# Patient Record
Sex: Female | Born: 1970 | Race: Black or African American | Hispanic: No | Smoking: Never smoker
Health system: Southern US, Community
[De-identification: ages and names within clinical notes are randomized; demographics above are authoritative.]

## PROBLEM LIST (undated history)

## (undated) DIAGNOSIS — G40909 Epilepsy, unspecified, not intractable, without status epilepticus: Secondary | ICD-10-CM

## (undated) HISTORY — PX: BREAST SURGERY: SHX581

## (undated) HISTORY — PX: STOMACH SURGERY: SHX791

## (undated) HISTORY — PX: TONSILLECTOMY: SUR1361

---

## 2019-07-22 ENCOUNTER — Other Ambulatory Visit: Payer: Self-pay

## 2019-07-22 ENCOUNTER — Emergency Department (HOSPITAL_BASED_OUTPATIENT_CLINIC_OR_DEPARTMENT_OTHER): Payer: Managed Care, Other (non HMO)

## 2019-07-22 ENCOUNTER — Emergency Department (HOSPITAL_BASED_OUTPATIENT_CLINIC_OR_DEPARTMENT_OTHER)
Admission: EM | Admit: 2019-07-22 | Discharge: 2019-07-22 | Disposition: A | Discharge status: Home / Self Care | Payer: Managed Care, Other (non HMO) | Attending: Emergency Medicine | Admitting: Emergency Medicine

## 2019-07-22 ENCOUNTER — Encounter (HOSPITAL_BASED_OUTPATIENT_CLINIC_OR_DEPARTMENT_OTHER): Payer: Self-pay

## 2019-07-22 DIAGNOSIS — Y998 Other external cause status: Secondary | ICD-10-CM | POA: Diagnosis not present

## 2019-07-22 DIAGNOSIS — S39012A Strain of muscle, fascia and tendon of lower back, initial encounter: Secondary | ICD-10-CM | POA: Diagnosis not present

## 2019-07-22 DIAGNOSIS — S20211A Contusion of right front wall of thorax, initial encounter: Secondary | ICD-10-CM | POA: Diagnosis not present

## 2019-07-22 DIAGNOSIS — Z79899 Other long term (current) drug therapy: Secondary | ICD-10-CM | POA: Insufficient documentation

## 2019-07-22 DIAGNOSIS — W108XXA Fall (on) (from) other stairs and steps, initial encounter: Secondary | ICD-10-CM | POA: Diagnosis not present

## 2019-07-22 DIAGNOSIS — Y92018 Other place in single-family (private) house as the place of occurrence of the external cause: Secondary | ICD-10-CM | POA: Diagnosis not present

## 2019-07-22 DIAGNOSIS — S59911A Unspecified injury of right forearm, initial encounter: Secondary | ICD-10-CM | POA: Diagnosis present

## 2019-07-22 DIAGNOSIS — S5011XA Contusion of right forearm, initial encounter: Secondary | ICD-10-CM | POA: Diagnosis not present

## 2019-07-22 DIAGNOSIS — Y9301 Activity, walking, marching and hiking: Secondary | ICD-10-CM | POA: Diagnosis not present

## 2019-07-22 HISTORY — DX: Epilepsy, unspecified, not intractable, without status epilepticus: G40.909

## 2019-07-22 MED ORDER — ACETAMINOPHEN 500 MG PO TABS
1000.0000 mg | ORAL_TABLET | Freq: Once | ORAL | Status: AC
  Administered 2019-07-22: 1000 mg via ORAL
  Filled 2019-07-22: qty 2

## 2019-07-22 NOTE — ED Triage Notes (Signed)
Pt arrives ambulatory POV with reports of falling down stairs in her home yesterday pt did try to take ibuprofen last night but reports worsening pain today with bruising to right arm, ribs, and leg.

## 2019-07-22 NOTE — ED Provider Notes (Signed)
[facility] EMERGENCY DEPARTMENT Provider Note   CSN: [id number] Arrival date & time: 07/22/19  1452     History Chief Complaint  Patient presents with  . Fall    Sherry Hoover is a 49 y.o. female.  Patient s/p fall at home yesterday. States was on steps, slipped, fell down on back and right side. No loc. Has been ambulatory since. C/o contusion/pain to right forearm, right lower ribs, and lower back. Pain acute onset post fall, moderate, dull, non radiating, worse w palpation. No radicular pain. No numbness/weakness. No headache. No nausea/vomiting. No cp or sob. No abd pain. Skin intact. Denies other pain or injury. No faintness or dizziness prior to fall.  The history is provided by the patient.  Fall Pertinent negatives include no chest pain, no abdominal pain, no headaches and no shortness of breath.       Past Medical History:  Diagnosis Date  . Epilepsy (HCC)     There are no problems to display for this patient.   Past Surgical History:  Procedure Laterality Date  . BREAST SURGERY    . STOMACH SURGERY    . TONSILLECTOMY       OB History   No obstetric history on file.     No family history on file.  Social History   Tobacco Use  . Smoking status: Never Smoker  . Smokeless tobacco: Never Used  Substance Use Topics  . Alcohol use: Never  . Drug use: Never    Home Medications Prior to Admission medications   Medication Sig Start Date End Date Taking? Authorizing Provider  ARIPiprazole (ABILIFY) 10 MG tablet Take 10 mg by mouth daily.   Yes [provider]  lamoTRIgine (LAMICTAL) 25 MG tablet Take 25 mg by mouth daily.   Yes [provider]  levETIRAcetam (KEPPRA) 100 MG/ML solution Take by mouth 2 (two) times daily.   Yes [provider]    Allergies    Patient has no known allergies.  Review of Systems   Review of Systems  Constitutional: Negative for fever.  HENT: Negative for nosebleeds.   Eyes:  Negative for pain and visual disturbance.  Respiratory: Negative for shortness of breath.   Cardiovascular: Negative for chest pain.  Gastrointestinal: Negative for abdominal pain and vomiting.  Genitourinary: Negative for flank pain.  Musculoskeletal: Negative for neck pain.  Skin: Negative for wound.  Neurological: Negative for weakness, numbness and headaches.  Hematological: Does not bruise/bleed easily.       No anticoag use.   Psychiatric/Behavioral: Negative for confusion.    Physical Exam Updated Vital Signs BP 133/84 (BP Location: Left Arm)   Pulse 75   Temp 98.8 F (37.1 C) (Oral)   Resp 18   Ht 1.6 m (5\' 3" )   Wt 54.9 kg   LMP 06/28/2019   SpO2 100%   BMI 21.43 kg/m   Physical Exam Vitals and nursing note reviewed.  Constitutional:      Appearance: Normal appearance. She is well-developed.  HENT:     Head: Atraumatic.     Nose: Nose normal.     Mouth/Throat:     Mouth: Mucous membranes are moist.  Eyes:     General: No scleral icterus.    Conjunctiva/sclera: Conjunctivae normal.     Pupils: Pupils are equal, round, and reactive to light.  Neck:     Trachea: No tracheal deviation.  Cardiovascular:     Rate and Rhythm: Normal rate and regular rhythm.  Pulses: Normal pulses.     Heart sounds: Normal heart sounds. No murmur. No friction rub. No gallop.   Pulmonary:     Effort: Pulmonary effort is normal. No respiratory distress.     Breath sounds: Normal breath sounds.     Comments: Right lower chest/rib tenderness. No crepitus. Normal chest movement.  Chest:     Chest wall: Tenderness present.  Abdominal:     General: Bowel sounds are normal. There is no distension.     Palpations: Abdomen is soft.     Tenderness: There is no abdominal tenderness. There is no guarding.     Comments: No abd contusion, bruising, pain or tenderness.   Genitourinary:    Comments: No cva tenderness.  Musculoskeletal:        General: No swelling.     Cervical back:  Normal range of motion and neck supple. No rigidity or tenderness. No muscular tenderness.     Comments: Lower lumbar tenderness, otherwise, CTLS spine, non tender, aligned, no step off.  Contusion/bruise to mid right forearm, no significant sts noted, tenderness to mid ulna. No crepitus. Compartments of forearm soft, not tense, no significant swelling. Radial pulse 2+ bil. No other focal bony tenderness noted.   Skin:    General: Skin is warm and dry.     Findings: No rash.  Neurological:     Mental Status: She is alert.     Comments: Alert, speech normal. Motor intact bil, sens grossly intact bil. Steady gait.   Psychiatric:        Mood and Affect: Mood normal.     ED Results / Procedures / Treatments   Labs (all labs ordered are listed, but only abnormal results are displayed) Labs Reviewed - No data to display  EKG None  Radiology XR Chest w R ribs  Result Date: 07/22/2019 CLINICAL DATA:  Pain status post fall EXAM: RIGHT RIBS AND CHEST - 3+ VIEW COMPARISON:  None. FINDINGS: No fracture or other bone lesions are seen involving the ribs. There is no evidence of pneumothorax or pleural effusion. Both lungs are clear. Heart size and mediastinal contours are within normal limits. IMPRESSION: Negative. Electronically Signed   By: Katherine Mantle M.D.   On: 07/22/2019 16:12   XR LS Spine  Result Date: 07/22/2019 CLINICAL DATA:  Pain status post fall EXAM: LUMBAR SPINE - COMPLETE 4+ VIEW COMPARISON:  None. FINDINGS: There is no acute displaced fracture. No dislocation. Facet arthrosis is noted at the lower lumbar segments. The disc heights are relatively well preserved except for the L5-S1 level where there is mild disc height loss. Right-sided tubal ligation clips are noted. There are no tubal ligation clips in the patient's left hemipelvis. IMPRESSION: 1. No acute abnormality. 2. Facet arthrosis at the lower lumbar segments. 3. Right-sided tubal ligation clips are noted. There are no  tubal ligation clips in the patient's left hemipelvis. Electronically Signed   By: Katherine Mantle M.D.   On: 07/22/2019 16:14   DG Forearm Right  Result Date: 07/22/2019 CLINICAL DATA:  Pain status post fall EXAM: RIGHT FOREARM - 2 VIEW COMPARISON:  None. FINDINGS: There is no evidence of fracture or other focal bone lesions. Soft tissues are unremarkable. IMPRESSION: Negative. Electronically Signed   By: Katherine Mantle M.D.   On: 07/22/2019 16:14    Procedures Procedures (including critical care time)  Medications Ordered in ED Medications  acetaminophen (TYLENOL) tablet 1,000 mg (has no administration in time range)    ED  Course  I have reviewed the triage vital signs and the nursing notes.  Pertinent labs & imaging results that were available during my care of the patient were reviewed by me and considered in my medical decision making (see chart for details).    MDM Rules/Calculators/A&P                      Imaging studies ordered.   Took ibuprofen at home today, no other meds for pain. Acetaminophen po. Po fluids.  Reviewed nursing notes and prior charts for additional history.   Xrays reviewed/interpreted by me - no fracture. Discussed w pt.   Patient currently appears stable for d/c.   Return precautions provided.      Final Clinical Impression(s) / ED Diagnoses Final diagnoses:  None    Rx / DC Orders ED Discharge Orders    None       Lajean Saver, MD 07/22/19 [id number]

## 2019-07-22 NOTE — Discharge Instructions (Addendum)
It was our pleasure to provide your ER care today - we hope that you feel better.  Take acetaminophen or ibuprofen as need for pain.  Follow up with primary care doctor in 1-2 weeks if symptoms fail to improve/resolve.  Return to ER if worse, new symptoms, new or severe pain, numbness/weakness, weak/faint, or other concern.

## 2020-03-17 IMAGING — CR DG FOREARM 2V*R*
2 series · 2 of 2 positions shown · non-contrast
Comparison: None.

CLINICAL DATA: Pain status post fall

EXAM:
RIGHT FOREARM - 2 VIEW

[x forearm ap right]
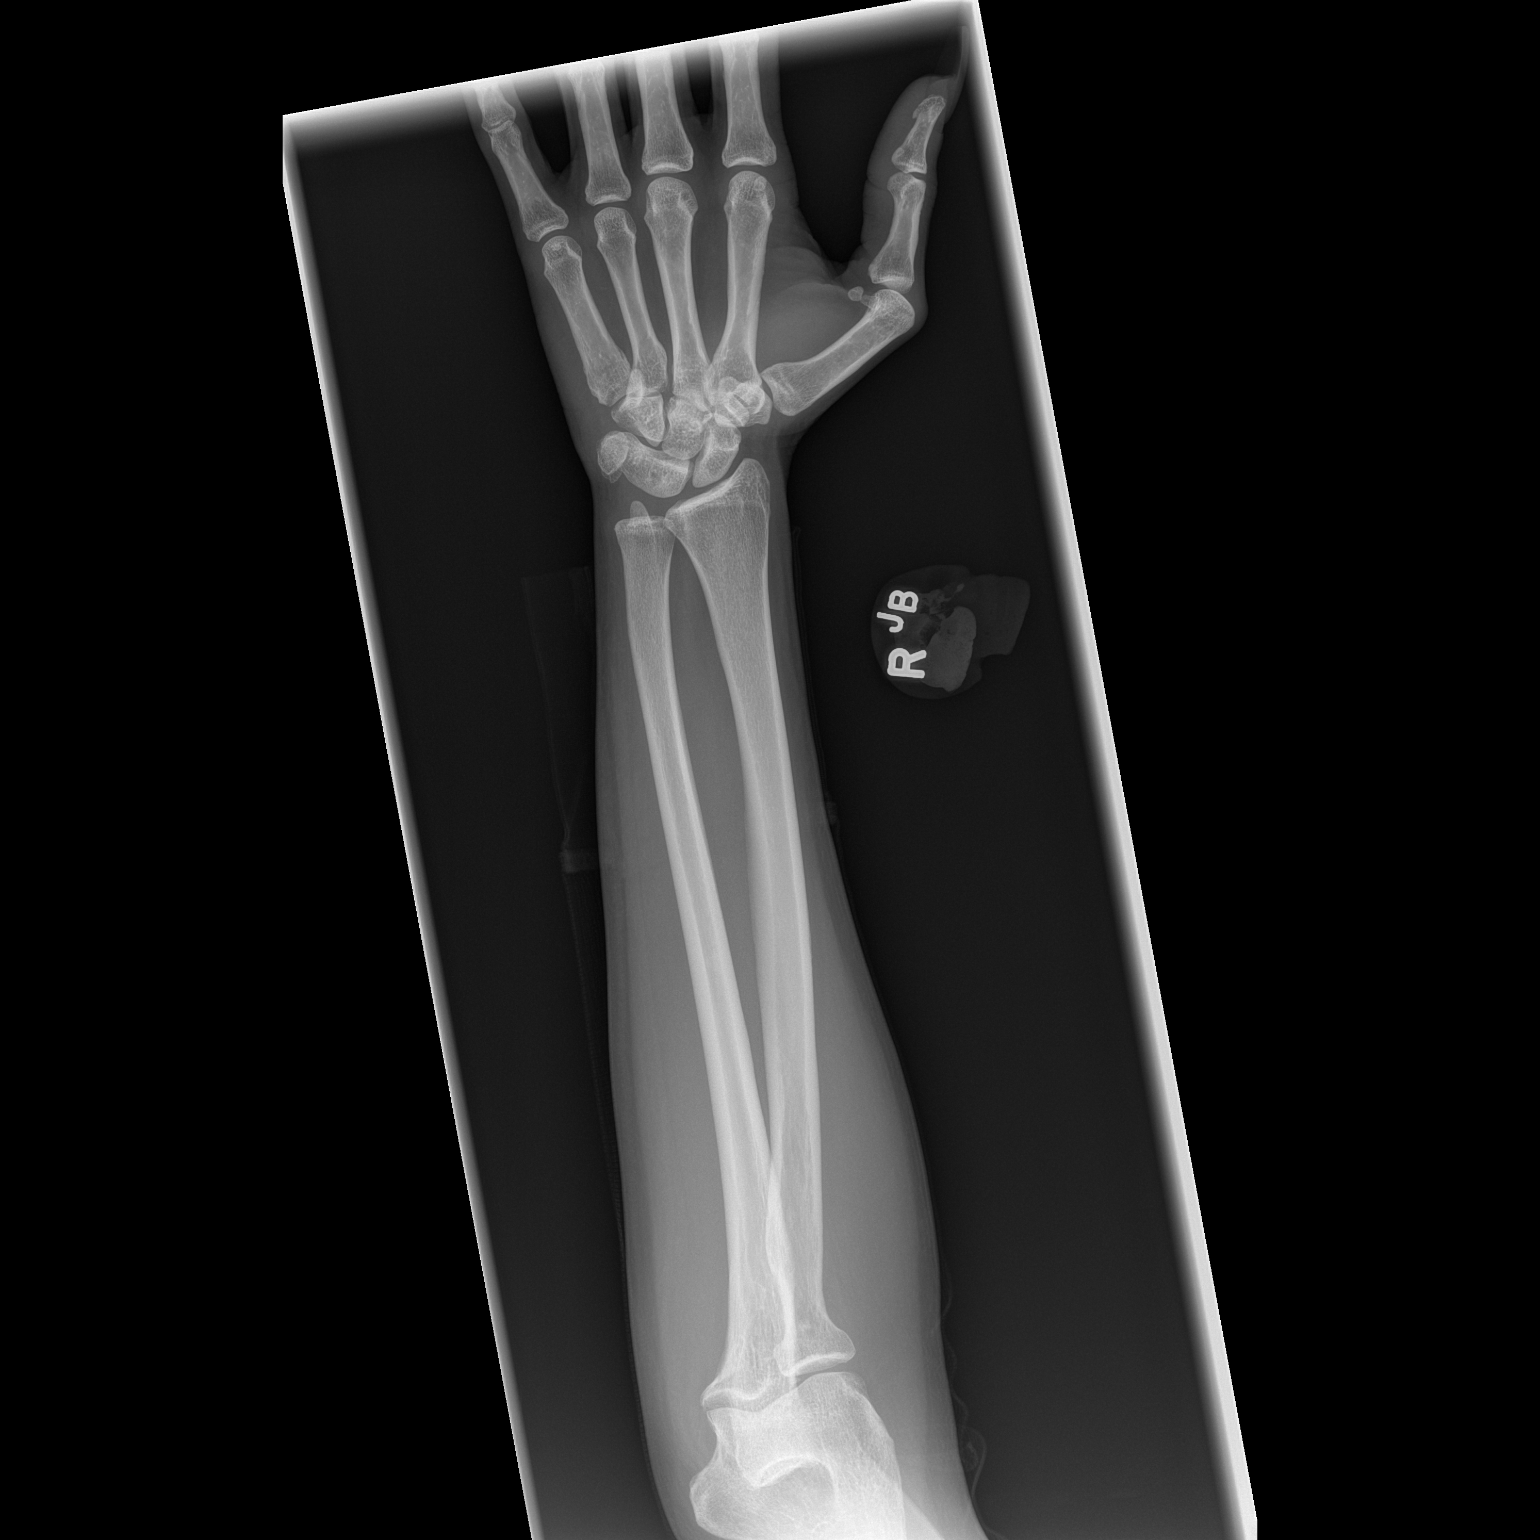

[x forearm lat right]
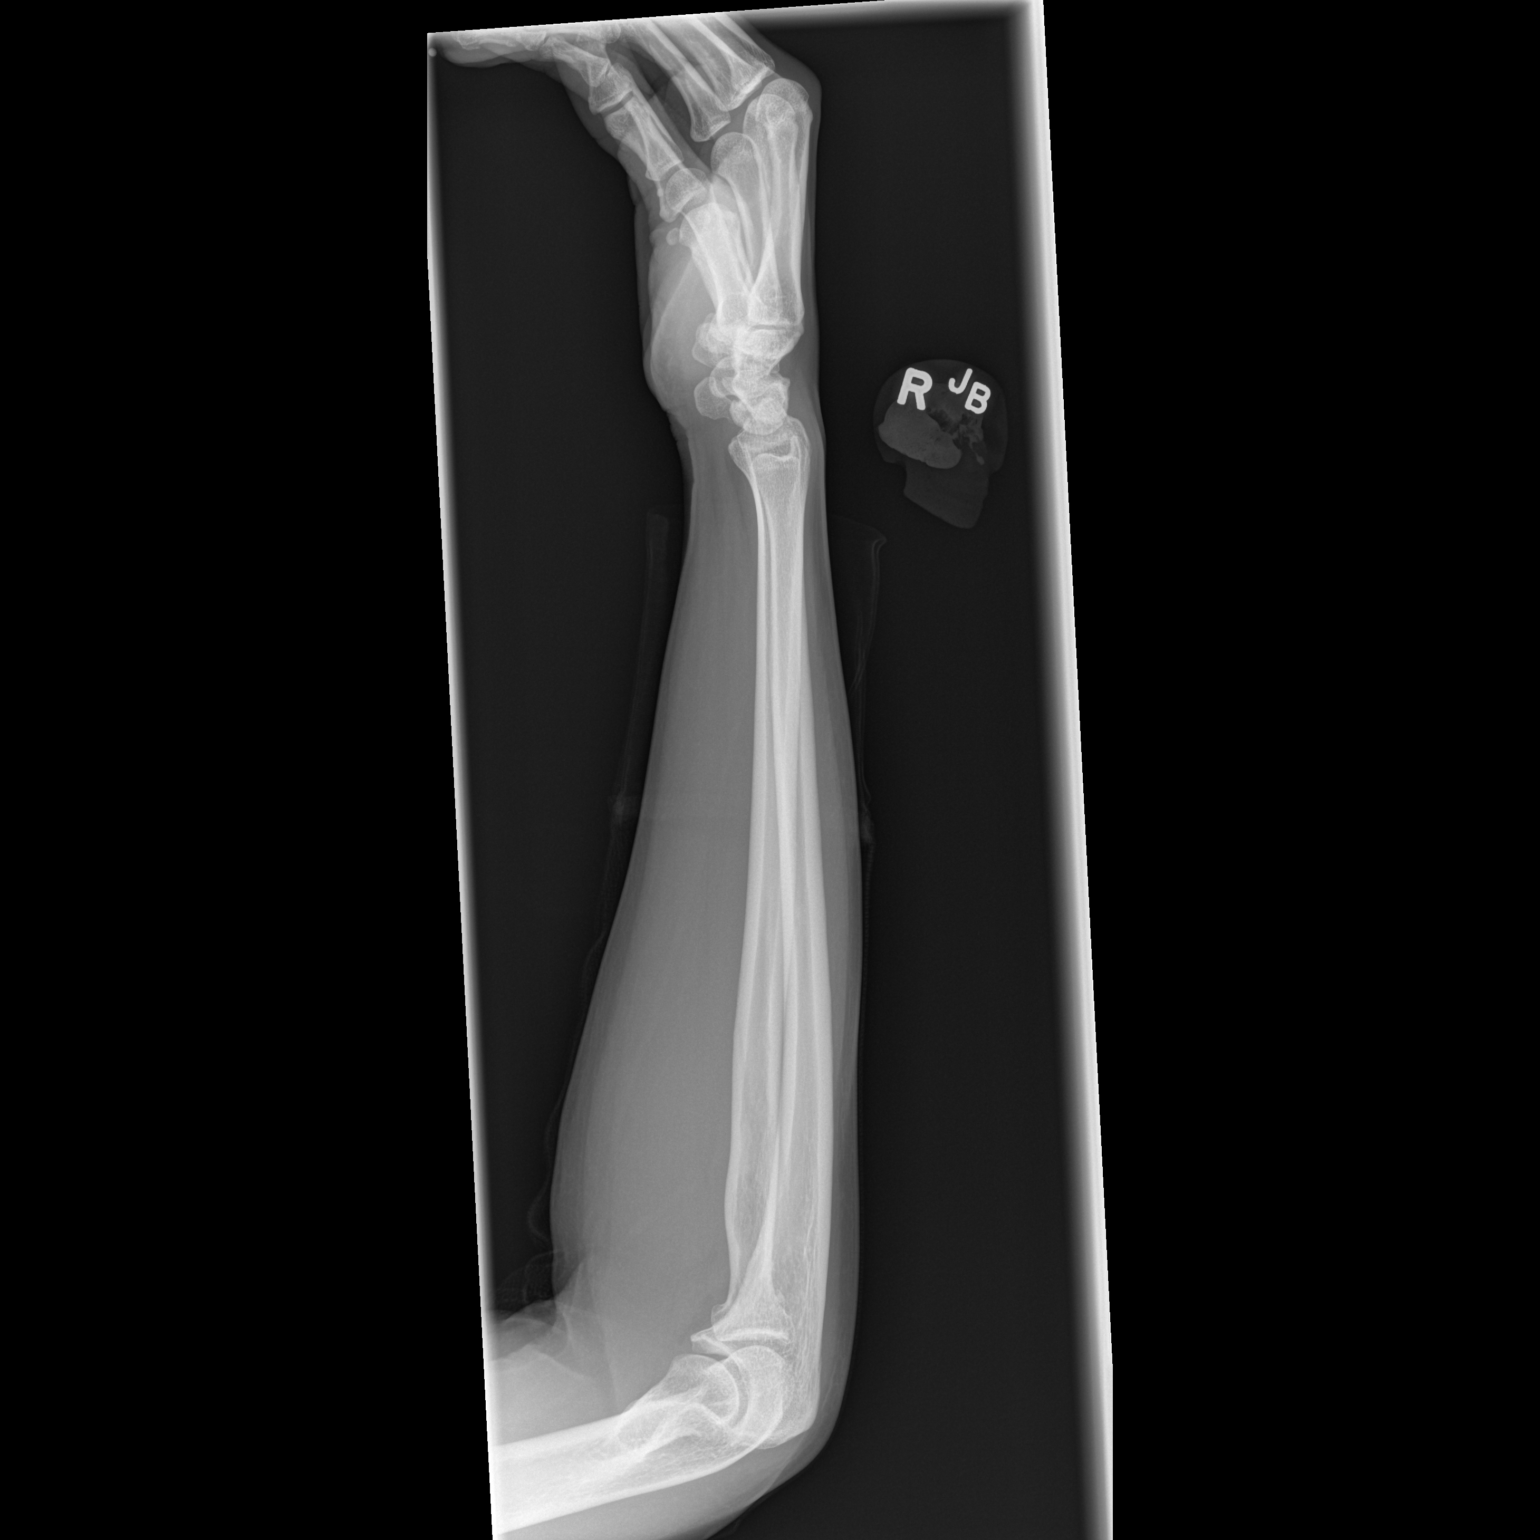

[2 of 2 positions shown; findings below may reference images not displayed]

FINDINGS: There is no evidence of fracture or other focal bone lesions. Soft
tissues are unremarkable.
IMPRESSION: Negative.

## 2023-08-10 ENCOUNTER — Other Ambulatory Visit (HOSPITAL_COMMUNITY): Payer: No Typology Code available for payment source | Attending: Psychiatry | Admitting: Psychiatry

## 2023-08-10 ENCOUNTER — Telehealth (HOSPITAL_COMMUNITY): Payer: Self-pay | Admitting: Psychiatry

## 2023-08-10 DIAGNOSIS — Z1330 Encounter for screening examination for mental health and behavioral disorders, unspecified: Secondary | ICD-10-CM

## 2023-08-10 NOTE — Telephone Encounter (Signed)
D:  Sardie Izzard, LCSW from [facility] referred pt to virtual MH-IOP.  A:  Placed call and oriented pt.  CCA scheduled today at 2:30 pm; start MH-IOP tomorrow.  Inform Sardie.  R:  Pt receptive.

## 2023-08-10 NOTE — Progress Notes (Addendum)
Virtual Visit via Video Note  I connected with Sherry Hoover on @TODAY @ at  2:30 PM EST by a video enabled telemedicine application and verified that I am speaking with the correct person using two identifiers.  Location: Patient: at home Provider: at office   I discussed the limitations of evaluation and management by telemedicine and the availability of in person appointments. The patient expressed understanding and agreed to proceed.  I discussed the assessment and treatment plan with the patient. The patient was provided an opportunity to ask questions and all were answered. The patient agreed with the plan and demonstrated an understanding of the instructions.   The patient was advised to call back or seek an in-person evaluation if the symptoms worsen or if the condition fails to improve as anticipated.  I provided 80 minutes of non-face-to-face time during this encounter.   Sherry Hoover, M.Ed,CNA   Comprehensive Clinical Assessment (CCA) Note  08/10/2023 Sherry Hoover [id number]  Chief Complaint:  Chief Complaint  Patient presents with   Depression   Visit Diagnosis: F31.32     CCA Screening, Triage and Referral (STR)  Patient Reported Information How did you hear about [place]? Other (Comment)  Referral name: [facility]  Referral phone number: No data recorded  Whom do you see for routine medical problems? Primary Care  Practice/Facility Name: [facility]  Practice/Facility Phone Number: No data recorded Name of Contact: No data recorded Contact Number: No data recorded Contact Fax Number: No data recorded Prescriber Name: Sherry Hoover  Prescriber Address (if known): No data recorded  What Is the Reason for Your Visit/Call Today? Worsening depressive symptoms  How Long Has This Been Causing You Problems? 1-6 months  What Do You Feel Would Help You the Most Today? Treatment for Depression or other mood problem   Have You Recently Been in Any Inpatient Treatment  (Hospital/Detox/Crisis Center/28-Day Program)? No  Name/Location of Program/Hospital:No data recorded How Long Were You There? No data recorded When Were You Discharged? No data recorded  Have You Ever Received Services From [facility] Before? No  Who Do You See at [facility]? No data recorded  Have You Recently Had Any Thoughts About Hurting Yourself? Yes (passively; no plan or intent)  Are You Planning to Commit Suicide/Harm Yourself At This time? No   Have you Recently Had Thoughts About Hurting Someone Sherry Hoover? No  Explanation: No data recorded  Have You Used Any Alcohol or Drugs in the Past 24 Hours? No  How Long Ago Did You Use Drugs or Alcohol? No data recorded What Did You Use and How Much? No data recorded  Do You Currently Have a Therapist/Psychiatrist? Yes  Name of Therapist/Psychiatrist: Dr. Dara Hoover at [facility]   Have You Been Recently Discharged From Any Office Practice or Programs? No  Explanation of Discharge From Practice/Program: No data recorded    CCA Screening Triage Referral Assessment Type of Contact: No data recorded Is this Initial or Reassessment? No data recorded Date Telepsych consult ordered in [facility]:  No data recorded Time Telepsych consult ordered in [facility]:  No data recorded  Patient Reported Information Reviewed? No data recorded Patient Left Without Being Seen? No data recorded Reason for Not Completing Assessment: No data recorded  Collateral Involvement: No data recorded  Does Patient Have a Court Appointed Legal Guardian? No data recorded Name and Contact of Legal Guardian: No data recorded If Minor and Not Living with Parent(s), Who has Custody? No data recorded Is CPS involved or ever been involved?  Never  Is APS involved or ever been involved? Never   Patient Determined To Be At Risk for Harm To Self or Others Based on Review of Patient Reported Information or Presenting Complaint? No  Method: No Plan  Availability of Means:  No access or NA  Intent: Vague intent or NA  Notification Required: No need or identified person  Additional Information for Danger to Others Potential: No data recorded Additional Comments for Danger to Others Potential: No data recorded Are There Guns or Other Weapons in Your Home? No  Types of Guns/Weapons: No data recorded Are These Weapons Safely Secured?                            No data recorded Who Could Verify You Are Able To Have These Secured: No data recorded Do You Have any Outstanding Charges, Pending Court Dates, Parole/Probation? denies  Contacted To Inform of Risk of Harm To Self or Others: No data recorded  Location of Assessment: Other (comment)   Does Patient Present under Involuntary Commitment? No  IVC Papers Initial File Date: No data recorded  [place] of Residence: [place]   Patient Currently Receiving the Following Services: Medication Management   Determination of Need: Routine (7 days)   Options For Referral: Intensive Outpatient Therapy     CCA Biopsychosocial Intake/Chief Complaint:  This is a 53 yr old, widowed, unemployed female, who was referred per [facility]; treatment for Bipolar 1 D/O and functional neurological d/o.  Pt reports she was dx with bipolar d/o ~ seven yrs ago.  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  Sister passed in 03-26-2023; husband died d/t heart issues in 10-14-24and shortly after pt's mother passed in [place].  Pt moved her father down here with her.  Pt is his caretaker.  Pt states she thinks her father has some mental health issues.  2) Medical Issues:  Epilepsy and Functional Neurological Disorder (FND).  Pt denied any prior psychiatric hospitalizations; except for Epilepsy (Nov. 2024).  Has been seeing Sherry Hoover @ [facility].  Admits to a suicide attempt (OD) in [employer].  Current Symptoms/Problems: Sadness, tearfulness, anhedonia, flucuating sleep, low  energy, decreased appetite, low self-esteem, poor concentration, isolative, irritable, restless,   Patient Reported Schizophrenia/Schizoaffective Diagnosis in Past: No   Strengths: "I am an advocate for others."  Preferences: "I want to get involved with people again."  Abilities: No data recorded  Type of Services Patient Feels are Needed: MH-IOP   Initial Clinical Notes/Concerns: PHQ-9=27   Mental Health Symptoms Depression:  Change in energy/activity; Difficulty Concentrating; Increase/decrease in appetite; Sleep (too much or little); Tearfulness; Hopelessness; Worthlessness; Irritability   Duration of Depressive symptoms: Greater than two weeks   Mania:  Change in energy/activity; Racing thoughts; Increased Energy (states she has "burst of energy at times"  Most recent this morning (ie. rearraging and cleaning things in her room))   Anxiety:   Difficulty concentrating; Restlessness   Psychosis:  None   Duration of Psychotic symptoms: No data recorded  Trauma:  N/A   Obsessions:  N/A; Disrupts routine/functioning; Recurrent & persistent thoughts/impulses/images   Compulsions:  N/A; Disrupts with routine/functioning; Intrusive/time consuming (checking and counting)   Inattention:  N/A   Hyperactivity/Impulsivity:  N/A   Oppositional/Defiant Behaviors:  N/A   Emotional Irregularity:  N/A   Other Mood/Personality Symptoms:  No data recorded   Mental Status  Exam Appearance and self-care  Stature:  Average   Weight:  Average weight   Clothing:  Casual   Grooming:  Normal   Cosmetic use:  Age appropriate   Posture/gait:  Normal   Motor activity:  Restless   Sensorium  Attention:  Distractible   Concentration:  Scattered; Preoccupied   Orientation:  Time; Situation; Place; Person; Object; X5   Recall/memory:  Defective in Remote   Affect and Mood  Affect:  Anxious   Mood:  Depressed   Relating  Eye contact:  Normal   Facial expression:   Responsive   Attitude toward examiner:  Cooperative   Thought and Language  Speech flow: Normal   Thought content:  Appropriate to Mood and Circumstances   Preoccupation:  None   Hallucinations:  None   Organization:  No data recorded  [employer] of Knowledge:  Fair   Intelligence:  Average   Abstraction:  Concrete   Judgement:  Normal   Reality Testing:  Variable   Insight:  Flashes of insight   Decision Making:  Paralyzed   Social Functioning  Social Maturity:  Isolates   Social Judgement:  Normal   Stress  Stressors:  Grief/losses; Illness; Transitions   Coping Ability:  Overwhelmed   Skill Deficits:  Decision making; Activities of daily living; Self-care   Supports:  Family     Religion: Religion/Spirituality Are You A Religious Person?: Yes What is Your Religious Affiliation?: Methodist  Leisure/Recreation: Leisure / Recreation Do You Have Hobbies?: Yes Leisure and Hobbies: cooking, cleaning and organizing  Exercise/Diet: Exercise/Diet Do You Exercise?: Yes What Type of Exercise Do You Do?: Weight Training How Many Times a Week Do You Exercise?: 4-5 times a week Have You Gained or Lost A Significant Amount of Weight in the Past Six Months?: No Do You Follow a Special Diet?: Yes Type of Diet: increased protein, fruits, vegetables Do You Have Any Trouble Sleeping?: Yes Explanation of Sleeping Difficulties: sleeps flucuates   CCA Employment/Education Employment/Work Situation: Employment / Work Situation Employment Situation: Unemployed What is the Longest Time Patient has Held a Job?: 1 yr Where was the Patient Employed at that Time?: [employer]; Runner, broadcasting/film/video (K-5) Has Patient ever Been in the [employer]?: No  Education: Education Is Patient Currently Attending School?: No Did Garment/textile technologist From [employer]?: Yes Did Theme park manager?: Yes What Type of College Degree Do you Have?: BA Did You Attend Graduate School?: No  (Some classes) What Was Your Major?: Education Did You Have An Individualized Education Program (IIEP): No Did You Have Any Difficulty At School?: No   CCA Family/Childhood History Family and Relationship History: Family history Marital status: Widowed Widowed, when?: Oct. 2024 What is your sexual orientation?: straight Does patient have children?: Yes How many children?: 4 How is patient's relationship with their children?: 3 sons and 1 daughter  Childhood History:  Childhood History By whom was/is the patient raised?: Both parents Additional childhood history information: Born in [place]. Mom was homemaker.  Father has his masters.  She believes he has mental issues.  Mother took care of everything.  States she can't really remember her childhood. Does patient have siblings?: Yes Number of Siblings: 1 Description of patient's current relationship with siblings: Four total; two passed. Did patient suffer any verbal/emotional/physical/sexual abuse as a child?: No Did patient suffer from severe childhood neglect?: No Has patient ever been sexually abused/assaulted/raped as an adolescent or adult?: No Was the patient ever a victim of a crime  or a disaster?: No Witnessed domestic violence?: No Has patient been affected by domestic violence as an adult?: No  Child/Adolescent Assessment:     CCA Substance Use Alcohol/Drug Use: Alcohol / Drug Use Pain Medications: cc: MAR Prescriptions: cc: MAR Over the Counter: cc: MAR History of alcohol / drug use?: No history of alcohol / drug abuse                         ASAM's:  Six Dimensions of Multidimensional Assessment  Dimension 1:  Acute Intoxication and/or Withdrawal Potential:      Dimension 2:  Biomedical Conditions and Complications:      Dimension 3:  Emotional, Behavioral, or Cognitive Conditions and Complications:     Dimension 4:  Readiness to Change:     Dimension 5:  Relapse, Continued use, or Continued  Problem Potential:     Dimension 6:  Recovery/Living Environment:     ASAM Severity Score:    ASAM Recommended Level of Treatment:     Substance use Disorder (SUD)    Recommendations for Services/Supports/Treatments: Recommendations for Services/Supports/Treatments Recommendations For Services/Supports/Treatments: IOP (Intensive Outpatient Program)  DSM5 Diagnoses: There are no active problems to display for this patient.   Patient Centered Plan: Patient is on the following Treatment Plan(s):  Depression Oriented pt.  Pt was advised of ROI must be obtained prior to any records release in order to collaborate her care with an outside provider.  Pt was advised if she has not already done so to contact the front desk to sign all necessary forms in order for MH-IOP to release info re: her care.  Consent:  Pt gives verbal consent for tx and assignment of benefits for services provided during this telehealth group process.  Pt expressed understanding and agreed to proceed. Collaboration of care:  Collaborate with Hillery Jacks, NP AEB; Dr. Park Pope AEB; Sherry Hoover, AEB; Noralee Stain, LCSW AEB.   Encouraged support groups through [facility].    Pt will improve her mood as evidenced by being happy again, managing her mood and coping with daily stressors for 5 out of 7 days for 60 days.  R:  Pt receptive.      Referrals to Alternative Service(s): Referred to Alternative Service(s):   Place:   Date:   Time:    Referred to Alternative Service(s):   Place:   Date:   Time:    Referred to Alternative Service(s):   Place:   Date:   Time:    Referred to Alternative Service(s):   Place:   Date:   Time:      @BHCOLLABOFCARE @  [place], RITA, M.Ed, CNA

## 2023-08-11 ENCOUNTER — Other Ambulatory Visit (HOSPITAL_COMMUNITY): Payer: No Typology Code available for payment source

## 2023-08-12 ENCOUNTER — Other Ambulatory Visit (HOSPITAL_COMMUNITY): Payer: No Typology Code available for payment source
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
## 2023-08-15 ENCOUNTER — Other Ambulatory Visit (HOSPITAL_COMMUNITY): Payer: No Typology Code available for payment source | Attending: Psychiatry | Admitting: Psychiatry

## 2023-08-15 ENCOUNTER — Encounter (HOSPITAL_COMMUNITY): Payer: Self-pay | Admitting: Psychiatry

## 2023-08-15 DIAGNOSIS — Z79899 Other long term (current) drug therapy: Secondary | ICD-10-CM | POA: Insufficient documentation

## 2023-08-15 DIAGNOSIS — R413 Other amnesia: Secondary | ICD-10-CM | POA: Insufficient documentation

## 2023-08-15 DIAGNOSIS — G4733 Obstructive sleep apnea (adult) (pediatric): Secondary | ICD-10-CM | POA: Insufficient documentation

## 2023-08-15 DIAGNOSIS — F319 Bipolar disorder, unspecified: Secondary | ICD-10-CM | POA: Diagnosis present

## 2023-08-15 NOTE — Progress Notes (Signed)
Virtual Visit via Video Note   I connected with Terina Lejeune on 08/15/23 at  9:00 AM EDT by a video enabled telemedicine application and verified that I am speaking with the correct person using two identifiers.   At orientation to the IOP program, Case Manager discussed the limitations of evaluation and management by telemedicine and the availability of in person appointments. The patient expressed understanding and agreed to proceed with virtual visits throughout the duration of the program.   Location:  Patient: Patient Home Provider: [facility]   History of Present Illness: Bipolar I Disorder    Observations/Objective: Check In: Case Manager checked in with all participants to review discharge dates, insurance authorizations, work-related documents and needs from the treatment team regarding medications. Tykiera stated needs and engaged in discussion.    Initial Therapeutic Activity: Counselor facilitated a check-in with Nadalie to assess for safety, sobriety and medication compliance.  Counselor also inquired about [employer]'s current emotional ratings, as well as any significant changes in thoughts, feelings or behavior since previous check in.  Treasure presented for session on time and was alert, oriented x5, with no evidence or self-report of active SI/HI or A/V H.  Kaydynce reported compliance with medication and denied use of alcohol or illicit substances.  Tifani reported scores of 7/10 for depression, 8/10 for anxiety, and 8/10 for irritability.  Ashayla denied any recent panic attacks.  Sun reported that a recent success has been trying to get approved for disability.  Rayya reported that a struggle has been dealing with outbursts, as she is grieving several losses, including her mother in December.  Josslyn reported that her goal today is to help her father with some tasks around the home.        Second Therapeutic Activity: Counselor invited members to participate in peaceful  place guided imagery activity today.  Counselor explained how this is a powerful visualization tool which can aid in reducing stress while increasing sense of calm, control, and awareness if practiced regularly.  Counselor informed members beforehand that if they became uncomfortable at any point during activity, they could stop and open their eyes.  Counselor invited members to get comfortable, achieve a relaxing breathing rhythm, close their eyes, and then guided them through process of creating a 'peaceful place' which filled them with safety and calm.  Counselor encouraged members to include sensory details involving vision, sound, touch, smell, and taste which they considered pleasant to enhance experience.  After practicing in session, counselor invited members to share their opinion on the activity, including whether they were able to imagine a specific place, what details stood out to them, and how this made them feel during and after.  Intervention effectiveness could not be measured, as client participated in exercise, but did not report about their overall experience or whether they would try this again on their own.    Assessment and Plan: Counselor recommends that Emmaleigh remain in IOP treatment to better manage mental health symptoms, ensure stability and pursue completion of treatment plan goals. Counselor recommends adherence to crisis/safety plan, taking medications as prescribed, and following up with medical professionals if any issues arise.    Follow Up Instructions: Counselor will send Webex link for session tomorrow.  Perpetua was advised to call back or seek an in-person evaluation if the symptoms worsen or if the condition fails to improve as anticipated.   Collaboration of Care:   Medication Management AEB Hillery Jacks, NP or Dr. Park Pope  Case Manager AEB [name], CNA    Patient/Guardian was advised Release of Information must be  obtained prior to any record release in order to collaborate their care with an outside provider. Patient/Guardian was advised if they have not already done so to contact the registration department to sign all necessary forms in order for [place] to release information regarding their care.    Consent: Patient/Guardian gives verbal consent for treatment and assignment of benefits for services provided during this visit. Patient/Guardian expressed understanding and agreed to proceed.   I provided 180 minutes of non-face-to-face time during this encounter.   Noralee Stain, LCSW, LCAS 08/15/23

## 2023-08-16 ENCOUNTER — Other Ambulatory Visit (HOSPITAL_COMMUNITY): Payer: No Typology Code available for payment source | Admitting: Psychiatry

## 2023-08-16 ENCOUNTER — Telehealth (HOSPITAL_COMMUNITY): Payer: Self-pay | Admitting: Psychiatry

## 2023-08-16 DIAGNOSIS — F319 Bipolar disorder, unspecified: Secondary | ICD-10-CM

## 2023-08-16 NOTE — Progress Notes (Signed)
 Virtual Visit via Video Note   I connected with Sherry Hoover on 08/16/23 at  9:00 AM EDT by a video enabled telemedicine application and verified that I am speaking with the correct person using two identifiers.   At orientation to the IOP program, Case Manager discussed the limitations of evaluation and management by telemedicine and the availability of in person appointments. The patient expressed understanding and agreed to proceed with virtual visits throughout the duration of the program.   Location:  Patient: Patient Home Provider: [facility]   History of Present Illness: Bipolar I Disorder    Observations/Objective: Check In: Case Manager checked in with all participants to review discharge dates, insurance authorizations, work-related documents and needs from the treatment team regarding medications. Sherry Hoover stated needs and engaged in discussion.    Initial Therapeutic Activity: Counselor facilitated a check-in with Sherry Hoover to assess for safety, sobriety and medication compliance.  Counselor also inquired about Sherry Hoover's current emotional ratings, as well as any significant changes in thoughts, feelings or behavior since previous check in.  Sherry Hoover presented for session on time and was alert, oriented x5, with no evidence or self-report of active SI/HI or A/V H.  Sherry Hoover reported compliance with medication and denied use of alcohol or illicit substances.  Sherry Hoover reported scores of 8/10 for depression, 7/10 for anxiety, and 7/10 for anger/irritability.  Sherry Hoover denied any recent outbursts or panic attacks.  Sherry Hoover reported that a recent struggle was isolating in her room yesterday after group, stating "I always think of some excuse not to go out".  Sherry Hoover reported that a struggle has been worrying about a flight back home this coming Saturday with her father to clear out her parents' house, which she anticipates will be stressful.  Sherry Hoover reported that her goal today is to get her  nails done for self-care.       Second Therapeutic Activity: Counselor introduced Amanda Davee Lomax, [facility] Chaplain to provide psychoeducation on topic of Grief and Loss with members today.  Sherry Hoover began discussion by checking in with the group about their baseline mood today, general thoughts on what grief means to them and how it has affected them personally in the past.  Sherry Hoover provided information on how the process of grief/loss can differ depending upon one's unique culture, and categories of loss one could experience (i.e. loss of a person, animal, relationship, job, identity, etc).  Sherry Hoover encouraged members to be mindful of how pervasive loss can be, and how to recognize signs which could indicate that this is having an impact on one's overall mental health and wellbeing.  Intervention effectiveness was mixed, as client did not participate aside from answering an icebreaker question posed at beginning by the chaplain.    Third Therapeutic Activity: Counselor provided demonstration of relaxation technique known as mindful breathing meditation to help members increase sense of calm, resiliency, and control.  Counselor guided members through process of getting comfortable, achieving a relaxed breathing rhythm, and focusing on this for several minutes, allowing troubling thoughts and feelings to come and go without rumination.  Counselor processed effectiveness of activity afterward in discussion with members, including how this impacted their mental state, whether it was difficult to stay focused, and if they plan to include it in self-care routine to improve day-to-day coping.  Intervention was effective, as evidenced by [name] participating in exercise, and reporting that this was helpful for relaxing her, although she almost fell asleep.  She reported that it could be useful at night  when she is anxious and having trouble sleeping.   Assessment and Plan: Counselor recommends that [name] remain in  IOP treatment to better manage mental health symptoms, ensure stability and pursue completion of treatment plan goals. Counselor recommends adherence to crisis/safety plan, taking medications as prescribed, and following up with medical professionals if any issues arise.    Follow Up Instructions: Counselor will send Webex link for session tomorrow.  [name] was advised to call back or seek an in-person evaluation if the symptoms worsen or if the condition fails to improve as anticipated.   Collaboration of Care:   Medication Management AEB Staci Kerns, NP or Dr. Prentice Espy                                          Case Manager AEB Ricka Gaskins, CNA    Patient/Guardian was advised Release of Information must be obtained prior to any record release in order to collaborate their care with an outside provider. Patient/Guardian was advised if they have not already done so to contact the registration department to sign all necessary forms in order for us  to release information regarding their care.    Consent: Patient/Guardian gives verbal consent for treatment and assignment of benefits for services provided during this visit. Patient/Guardian expressed understanding and agreed to proceed.   I provided 180 minutes of non-face-to-face time during this encounter.   [name], LCSW, LCAS 08/16/23

## 2023-08-17 ENCOUNTER — Telehealth (HOSPITAL_COMMUNITY): Payer: Self-pay | Admitting: Psychiatry

## 2023-08-17 ENCOUNTER — Other Ambulatory Visit (HOSPITAL_COMMUNITY): Payer: No Typology Code available for payment source

## 2023-08-17 NOTE — Telephone Encounter (Signed)
 D:  Pt had logged off group.  A:  Placed call to pt.  According to pt, her son showed up unexpectedly.  I have a family issue that I must take care of.  Asked pt if she would be able to rejoin group soon?  Pt replied that she would rejoin as soon as she could.  Inform [name], LCSW and Dr. Lynnette.

## 2023-08-17 NOTE — Telephone Encounter (Signed)
 D:  According to the group facilitator Fenton Ricker, [place]), patient never returned to virtual MH-IOP.  Spoke to [employer] (pharmacist) whose group pt was in.  According to [place], pt logged off five minutes before she ended group at 10:20 a.m..  A:  Placed call to check on patient but there was no answer.  Left vm requesting patient to call the MH-IOP case manager back.  Inform Dr. Lynnette and treatment team.

## 2023-08-17 NOTE — Telephone Encounter (Signed)
 D:  Follow up with patient since she never returned the case manager's call.  Placed call to pt.  Pt states she was laying down.  No, I didn't return to group because I was dealing with my son and my father.  I just took one of my pills and laid down.  I am in my room.  Pt denies SI/HI or A/V hallucinations.  States she is just resting.  I will be in group at 9 a.m in the morning.  A:  Provided pt with support.  Reiterated to pt, if she starts having any SI sx's to call [phone] or go to [facility] or nearest ED for an assessment.  Informed Dr. Lynnette and treatment team.  R:  Pt receptive.

## 2023-08-18 ENCOUNTER — Other Ambulatory Visit (HOSPITAL_COMMUNITY): Payer: No Typology Code available for payment source

## 2023-08-18 DIAGNOSIS — F319 Bipolar disorder, unspecified: Secondary | ICD-10-CM | POA: Diagnosis not present

## 2023-08-18 NOTE — Progress Notes (Signed)
 Virtual Visit via Video Note   I connected with Laverta Banh on 08/18/23 at  9:00 AM EDT by a video enabled telemedicine application and verified that I am speaking with the correct person using two identifiers.   At orientation to the IOP program, Case Manager discussed the limitations of evaluation and management by telemedicine and the availability of in person appointments. The patient expressed understanding and agreed to proceed with virtual visits throughout the duration of the program.   Location:  Patient: Patient Home Provider: Home Office   History of Present Illness: Bipolar I Disorder    Observations/Objective: Check In: Case Manager checked in with all participants to review discharge dates, insurance authorizations, work-related documents and needs from the treatment team regarding medications. Trenice stated needs and engaged in discussion.    Initial Therapeutic Activity: Counselor facilitated a check-in with Saanya to assess for safety, sobriety and medication compliance.  Counselor also inquired about Cherl's current emotional ratings, as well as any significant changes in thoughts, feelings or behavior since previous check in.  Tomia presented for session on time and was alert, oriented x5, with no evidence or self-report of active SI/HI or A/V H.  Kathleena reported compliance with medication and denied use of alcohol or illicit substances.  Jameria reported scores of 7/10 for depression, 6/10 for anxiety, and 5/10 for anger/irritability.  Mariabella denied any recent outbursts or panic attacks.  Robyn reported that a struggle was having to leave group early yesterday due to her son showing up unexpectedly and triggering a migraine.  Kelli reported that her goal today is start packing for her upcoming trip to [place]  this weekend, and take her father to the senior center to give herself some alone time.       Second Therapeutic Activity: Counselor introduced topic of  self-care today.  Counselor explained how this can be defined as the things one does to maintain good health and improve well-being.  Counselor provided members with a self-care assessment form to complete.  This handout featured various sub-categories of self-care, including physical, psychological/emotional, social, spiritual, and professional.  Members were asked to rank their engagement in the activities listed for each dimension on a scale of 1-3, with 1 indicating 'Poor', 2 indicating 'Ok', and 3 indicating 'Well'.  Counselor invited members to share results of their assessment, and inquired about which areas of self-care they are doing well in, as well as areas that require attention, and how they plan to begin addressing this during treatment.  Intervention was ineffective, as evidenced by Zoila reporting that she became overwhelmed by the first section, since she can hyperfixate on aspects of physical self-care such as diet and exercise.  Counselor suggested she focus on the second section instead, psychological/emotional self-care.  Aubrey reported that she needed to leave early before sharing results of this assessment, as someone had shown up unexpectedly at the house.  Mitzie left the session at 11:35am.  Counselor informed case manager of client's early departure from group.    Assessment and Plan: Counselor recommends that Zahniya remain in IOP treatment to better manage mental health symptoms, ensure stability and pursue completion of treatment plan goals. Counselor recommends adherence to crisis/safety plan, taking medications as prescribed, and following up with medical professionals if any issues arise.    Follow Up Instructions: Counselor will send Webex link for session tomorrow.  Adelynn was advised to call back or seek an in-person evaluation if the symptoms worsen or if the condition fails to improve  as anticipated.   Collaboration of Care:   Medication Management AEB Staci Kerns, NP or Dr. Prentice Espy                                          Case Manager AEB Ricka Gaskins, CNA    Patient/Guardian was advised Release of Information must be obtained prior to any record release in order to collaborate their care with an outside provider. Patient/Guardian was advised if they have not already done so to contact the registration department to sign all necessary forms in order for us  to release information regarding their care.    Consent: Patient/Guardian gives verbal consent for treatment and assignment of benefits for services provided during this visit. Patient/Guardian expressed understanding and agreed to proceed.   I provided 155 minutes of non-face-to-face time during this encounter.   [name], LCSW, LCAS 08/18/23

## 2023-08-18 NOTE — Patient Instructions (Signed)
 D:  Patient will complete virtual MH-IOP tomorrow (08-19-23) per her request.  A:  Follow up with Dr. Wyonia Hefty at [facility] on 09-08-23 @ 8 a.m (video).  Strongly encourage support groups through [facility] [employer] [facility] [phone].  R:  Patient receptive.

## 2023-08-19 ENCOUNTER — Other Ambulatory Visit (HOSPITAL_COMMUNITY): Payer: No Typology Code available for payment source | Admitting: Family

## 2023-08-19 DIAGNOSIS — F319 Bipolar disorder, unspecified: Secondary | ICD-10-CM

## 2023-08-19 NOTE — Progress Notes (Signed)
 Virtual Visit via Video Note  I connected with Sherry Hoover on @TODAY @ at  9:00 AM EST by a video enabled telemedicine application and verified that I am speaking with the correct person using two identifiers.  Location: Patient: at home Provider: at office   I discussed the limitations of evaluation and management by telemedicine and the availability of in person appointments. The patient expressed understanding and agreed to proceed.  I discussed the assessment and treatment plan with the patient. The patient was provided an opportunity to ask questions and all were answered. The patient agreed with the plan and demonstrated an understanding of the instructions.   The patient was advised to call back or seek an in-person evaluation if the symptoms worsen or if the condition fails to improve as anticipated.  I provided 30 minutes of non-face-to-face time during this encounter.   Sherry Hoover, M.Ed,CNA   Patient ID: Sherry Hoover, female   DOB: April 24, 1971, 53 y.o.   MRN: [id number] D:  This is a 53 yr old, widowed, unemployed female, who was referred per [facility]; treatment for Bipolar 1 D/O and functional neurological d/o.  Pt reports she was dx with bipolar d/o ~ seven yrs ago.  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  Sister passed in 04-17-23; husband died d/t heart issues in 2024-11-05and shortly after pt's mother passed in [place] .  Pt moved her father down here with her.  Pt is his caretaker.  Pt states she thinks her father has some mental health issues.  2) Medical Issues:  Epilepsy and Functional Neurological Disorder (FND).  Pt denied any prior psychiatric hospitalizations; except for Epilepsy (Nov. 2024).  Has been seeing Dr. Dorian Chandler @ [facility].  Admits to a suicide attempt (OD) in [place].   Pt only attended one week (5 days).  Requesting discharge today d/t flying out tomorrow morning to [place] .  States her brother  purchase tickets for she and her father to go to [place]  to assist with clearing out parent's home.  I thought since the group is on video that I could be anywhere.  Explained to patient that our providers aren't licensed in [place]  so she would have to be discharged.  According to pt, she would be out of state for two weeks.  Pt states she feels about the same.  Denies SI/HI or A/V hallucinations or paranoia.  On a scale of 1-10 (10 being the worst); pt rates her depression an anxiety at a 8.5. Pt inquired what will she do upon arrival from [place] .  Informed pt, she will follow up with Dr. Dorian Chandler on 09-08-23 @ 8 a.m (video).  Reiterated to pt, after today, she will be back under Dr. Anabel services.  Strongly encouraged pt to follow up with Dr. Chandler upon her return and discuss options for treatment (ie. Therapist, etc).  Also, discussed safety options at length with pt for if a crisis should arise while in [place] .  Informed pt by her disrupting her treatment and ending on her own accord, patients usually don't return back to [facility] MH-IOP until ~ three-four months later; in order to work with psychiatrist and therapist 1:1.  Informed pt that she can call the MH-IOP case manager after she returns to town if she would like to attend an IOP group and cm would give her some referral options. A:  Discharge today per patient's request.  F/U with Dr. Chandler on 09-08-23 @  8 a.m. (video) Pt was advised of ROI must be obtained prior to any records release in order to collaborate her care with an outside provider.  Pt was advised if she has not already done so to contact the front desk to sign all necessary forms in order for MH-IOP to release info re: her care. Reiterated to pt that a ROI is needed before case mgr can send Dr. Jasmine notes from her chart. Consent:  Pt gives verbal consent for tx and assignment of benefits for services provided during this telehealth group process.  Pt expressed understanding and  agreed to proceed. Collaboration of care:  Collaborate with Staci Kerns, NP AEB; Dr. Prentice Espy AEB; Dr. Dorian Jasmine, AEB; [name], LCSW AEB.   Encouraged support groups through [facility] [facility].  R:  Pt receptive.   Sherry Hoover, M.Ed,CNA

## 2023-08-19 NOTE — Progress Notes (Signed)
 Virtual Visit via Video Note   I connected with Sherry Hoover on 08/19/23 at  9:00 AM EDT by a video enabled telemedicine application and verified that I am speaking with the correct person using two identifiers.   At orientation to the IOP program, Case Manager discussed the limitations of evaluation and management by telemedicine and the availability of in person appointments. The patient expressed understanding and agreed to proceed with virtual visits throughout the duration of the program.   Location:  Patient: Patient Home Provider: [facility]   History of Present Illness: Bipolar I Disorder and GAD   Observations/Objective: Check In: Case Manager checked in with all participants to review discharge dates, insurance authorizations, work-related documents and needs from the treatment team regarding medications. Sherry Hoover stated needs and engaged in discussion.    Initial Therapeutic Activity: Counselor facilitated a check-in with Sherry Hoover to assess for safety, sobriety and medication compliance.  Counselor also inquired about Sherry Hoover's current emotional ratings, as well as any significant changes in thoughts, feelings or behavior since previous check in.  Sherry Hoover presented for session on time and was alert, oriented x5, with no evidence or self-report of active SI/HI or A/V H.  Sherry Hoover reported compliance with medication and denied use of alcohol or illicit substances.  Sherry Hoover reported scores of 8/10 for depression, 8/10 for anxiety, and 0/10 for anger/irritability.  Sherry Hoover denied any recent outbursts or panic attacks.  Sherry Hoover reported that a recent success was taking her dad to the senior center yesterday, which gave her a break from dealing with family stress.  Sherry Hoover reported that a struggle was having to leave group early yesterday when she had an Biomedical scientist show up earlier than planned to take her father to the senior center.  Sherry Hoover reported that her goal today is begin packing for  her trip to [place]  this weekend with her father.       Second Therapeutic Activity: Counselor introduced topic of building a social support network today.  Counselor explained how this can be defined as having a having a group of healthy people in one's life you can talk to, spend time with, and get help from to improve both mental and physical health.  Counselor noted that some barriers can make it difficult to connect with other people, including the presence of anxiety or depression, or moving to an unfamiliar area.  Group members were asked to assess the current state of their support network, and identify ways that this could be improved.  Tips were given on how to address previously noted barriers, such as strengthening social skills, using relaxation techniques to reduce anxiety, scheduling social time each week, and/or exploring social events nearby which could increase chances of meeting new supports.  Members were also encouraged to consider getting closer to people they already know through suggestions such as outreaching someone by text, email or phone call if they haven't spoken in awhile, doing something nice for a friend/family member unexpectedly, and/or inviting someone over for a game/movie/dinner night.  Intervention was effective, as evidenced by Sherry Hoover actively participating in discussion on the subject, and reporting that she used to have a strong support network, but over time she has ended up very isolated and alone.  Sherry Hoover reported that when her children were younger and in school, she would participate in activities where she met other parents with similar values and interests.  Sherry Hoover reported that she has also moved to several states, which made it difficult to maintain connections with  many friends.  Sherry Hoover stated "I don't even know where I would start at this point".  She reported that her goal will be to make new connections by meeting people engaged in workout classes at the  gym, attending support groups at [facility], joining a French language class, or volunteering with [employer].    Assessment and Plan: Sherry Hoover has requested to be discharged from MHIOP early due to need to assist family in another state.  Clinician recommends adherence to crisis/safety plan, taking medications as prescribed, and following up with medical professionals if any issues arise.    Follow Up Instructions: [name] was advised to call back or seek an in-person evaluation if the symptoms worsen or if the condition fails to improve as anticipated.   Collaboration of Care:   Medication Management AEB Staci Kerns, NP or Dr. Prentice Espy                                          Case Manager AEB Sherry Gaskins, CNA    Patient/Guardian was advised Release of Information must be obtained prior to any record release in order to collaborate their care with an outside provider. Patient/Guardian was advised if they have not already done so to contact the registration department to sign all necessary forms in order for us  to release information regarding their care.    Consent: Patient/Guardian gives verbal consent for treatment and assignment of benefits for services provided during this visit. Patient/Guardian expressed understanding and agreed to proceed.   I provided 180 minutes of non-face-to-face time during this encounter.   [name], LCSW, LCAS 08/19/23

## 2023-08-19 NOTE — Progress Notes (Addendum)
 Virtual Visit via Video Note  I connected with Sherry Hoover on 08/19/23 at  9:00 AM EST by a video enabled telemedicine application and verified that I am speaking with the correct person using two identifiers.  Location: Patient: Home Provider: Office   I discussed the limitations of evaluation and management by telemedicine and the availability of in person appointments. The patient expressed understanding and agreed to proceed.   I discussed the assessment and treatment plan with the patient. The patient was provided an opportunity to ask questions and all were answered. The patient agreed with the plan and demonstrated an understanding of the instructions.   The patient was advised to call back or seek an in-person evaluation if the symptoms worsen or if the condition fails to improve as anticipated.  I provided 15 minutes of non-face-to-face time during this encounter.   Staci LOISE Kerns, NP   [facility] Intensive [facility] Program Discharge Summary  Sherry Hoover [id number]  Admission date: 08/15/2023 Discharge date: 08/19/2023  Reason for admission: per admission assessment note: Sherry Hoover is a 53 y.o. female with PMH of bipolar disorder, migraine, vitamin b12 deficiency, iron deficiency anemia, OSA, vitamin D deficiency, epilepsy, functional neurological symptom disorder, no suicide attempt, no inpt psych admission, who presented for IOP virtually due to passive SI and worsening bipolar 1 symptoms. She has been struggling with multiple psychosocial stressors.  Progress in Program Toward Treatment Goals: Progressing attended participated with daily group session x 1 week.  States she has a family emergency that she has attended to in another state.  Denied concerns related to suicidal or homicidal ideations.  Denied any medication refills at this time.  She was seen and evaluated by this provider and TTS counselor M. Clark  Progress (rationale): Take all  of you medications as prescribed by your mental healthcare provider.  Report any adverse effects and reactions from your medications to your outpatient provider promptly.  Do not engage in alcohol and or illegal drug use while on prescription medicines.  - [facility] F/U with Dr. Jasmine on 09-08-23 @ 8 a.m. (video)   Keep all scheduled appointments. This is to ensure that you are getting refills on time and to avoid any interruption in your medication.  If you are unable to keep an appointment call to reschedule.  Be sure to follow up with resources and follow ups given. In the event of worsening symptoms call the crisis hotline, 911, and or go to the nearest emergency department for appropriate evaluation and treatment of symptoms. Follow-up with your primary care provider for your medical issues, concerns and or health care needs.    Collaboration of Care: Medication Management AEB continue with directed by current Dr Dorian Jasmine at [facility].   Patient/Guardian was advised Release of Information must be obtained prior to any record release in order to collaborate their care with an outside provider. Patient/Guardian was advised if they have not already done so to contact the registration department to sign all necessary forms in order for us  to release information regarding their care.   Consent: Patient/Guardian gives verbal consent for treatment and assignment of benefits for services provided during this visit. Patient/Guardian expressed understanding and agreed to proceed.   Staci LOISE Kerns, NP 08/19/2023

## 2023-08-22 ENCOUNTER — Other Ambulatory Visit (HOSPITAL_COMMUNITY): Payer: No Typology Code available for payment source

## 2023-08-23 ENCOUNTER — Other Ambulatory Visit (HOSPITAL_COMMUNITY): Payer: No Typology Code available for payment source

## 2023-08-24 ENCOUNTER — Other Ambulatory Visit (HOSPITAL_COMMUNITY): Payer: No Typology Code available for payment source

## 2023-08-25 ENCOUNTER — Other Ambulatory Visit (HOSPITAL_COMMUNITY): Payer: No Typology Code available for payment source

## 2023-08-26 ENCOUNTER — Other Ambulatory Visit (HOSPITAL_COMMUNITY): Payer: No Typology Code available for payment source

## 2023-08-29 ENCOUNTER — Other Ambulatory Visit (HOSPITAL_COMMUNITY): Payer: No Typology Code available for payment source

## 2023-08-30 ENCOUNTER — Other Ambulatory Visit (HOSPITAL_COMMUNITY): Payer: No Typology Code available for payment source

## 2023-08-31 ENCOUNTER — Other Ambulatory Visit (HOSPITAL_COMMUNITY): Payer: No Typology Code available for payment source

## 2023-09-01 ENCOUNTER — Other Ambulatory Visit (HOSPITAL_COMMUNITY): Payer: No Typology Code available for payment source

## 2023-09-02 ENCOUNTER — Other Ambulatory Visit (HOSPITAL_COMMUNITY): Payer: No Typology Code available for payment source

## 2023-09-05 ENCOUNTER — Other Ambulatory Visit (HOSPITAL_COMMUNITY): Payer: No Typology Code available for payment source

## 2023-09-06 ENCOUNTER — Other Ambulatory Visit (HOSPITAL_COMMUNITY): Payer: No Typology Code available for payment source

## 2023-09-07 ENCOUNTER — Other Ambulatory Visit (HOSPITAL_COMMUNITY): Payer: No Typology Code available for payment source

## 2023-09-08 ENCOUNTER — Other Ambulatory Visit (HOSPITAL_COMMUNITY): Payer: No Typology Code available for payment source

## 2023-09-09 ENCOUNTER — Other Ambulatory Visit (HOSPITAL_COMMUNITY): Payer: No Typology Code available for payment source

## 2023-09-12 ENCOUNTER — Telehealth (HOSPITAL_COMMUNITY): Payer: Self-pay | Admitting: Psychiatry

## 2023-09-12 NOTE — Telephone Encounter (Signed)
 D:  Patient's care team from [facility] inquired if pt could return to virtual MH-IOP since she has returned back in town.  A:  Discussed pt in treatment team this morning, per request of [facility].  Dr. Park Pope recommends pt to be referred to an in-person group d/t her acuity.  Pt can return to virtual MH-IOP 3-4 months later.  That is the recommended time for any former patient. Informed pt's case mgr ([place]) in order to inform Dr. Dara Hoyer.  According to [employer], [employer] will only pay for [facility]'s PHP.  This case mgr informed S[place]ee that would be a good fit for pt at this time based upon her acuity.

## 2023-12-28 ENCOUNTER — Telehealth (HOSPITAL_COMMUNITY): Payer: Self-pay | Admitting: Psychiatry

## 2023-12-28 NOTE — Telephone Encounter (Signed)
 D:  Sardee (case mgr) from [facility], along with pt called to inquire about pt returning to virtual MH-IOP.  Pt was last in MH-IOP in Feb. 2025.  A:  Will discuss with treatment team and call patient back; d/t already high group acuity at this time.  R:  Case Mgr and pt receptive.

## 2023-12-28 NOTE — Telephone Encounter (Signed)
 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  I want to start back with you all instead of the other recommended place.  I didn't go to the other place because I can't drive.  My case worker was assisting me with completing paperwork so I can get access to transportation.  Pt explained that her neurologist still doesn't want her to drive.  Pt recently had a scan of her brain.  My neurologist is looking to see if I am having small seizures or not.  Pt denies SI/HI or A/V hallucinations. A:  Provided pt with support.  Patient seems to still be having tangential thinking.  Had staffed pt with MH-IOP treatment team earlier.  Due to high acuity in group at this time; will reach back out to patient the end of June to schedule her to re-start MH-IOP in July.  Discussed other options for groups but pt declined stating she will wait for [facility]'s MH-IOP.  I'm fine, I can wait.  Inform treatment team.  R:  Pt receptive.

## 2024-01-05 ENCOUNTER — Telehealth (HOSPITAL_COMMUNITY): Payer: Self-pay | Admitting: Psychiatry

## 2024-01-05 NOTE — Telephone Encounter (Signed)
 D:  Placed call to patient re: starting back in virtual MH-IOP.  Pt chose to start after the 4th of July holiday.  Pt requesting to start on Mon. 01-16-24 2 9  a.m..  Informed pt, MH-IOP is closed on the 4th; therefore she will receive a group link either 01-12-24 or 01-16-24.  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 Pt denies SI/HI or A/ V hallucinations.  A:  Re-oriented pt.  Inform the treatment team.  R:  Pt receptive.

## 2024-01-16 ENCOUNTER — Encounter (HOSPITAL_COMMUNITY): Payer: Self-pay

## 2024-01-16 ENCOUNTER — Encounter (HOSPITAL_COMMUNITY): Payer: Self-pay | Admitting: Psychiatry

## 2024-01-16 ENCOUNTER — Other Ambulatory Visit (HOSPITAL_COMMUNITY): Attending: Psychiatry | Admitting: Licensed Clinical Social Worker

## 2024-01-16 DIAGNOSIS — F319 Bipolar disorder, unspecified: Secondary | ICD-10-CM | POA: Diagnosis present

## 2024-01-16 DIAGNOSIS — Z1330 Encounter for screening examination for mental health and behavioral disorders, unspecified: Secondary | ICD-10-CM | POA: Diagnosis present

## 2024-01-16 DIAGNOSIS — F411 Generalized anxiety disorder: Secondary | ICD-10-CM | POA: Insufficient documentation

## 2024-01-16 DIAGNOSIS — Z79899 Other long term (current) drug therapy: Secondary | ICD-10-CM | POA: Diagnosis not present

## 2024-01-16 DIAGNOSIS — R29818 Other symptoms and signs involving the nervous system: Secondary | ICD-10-CM | POA: Insufficient documentation

## 2024-01-16 DIAGNOSIS — R413 Other amnesia: Secondary | ICD-10-CM | POA: Diagnosis not present

## 2024-01-16 DIAGNOSIS — R29898 Other symptoms and signs involving the musculoskeletal system: Secondary | ICD-10-CM | POA: Diagnosis not present

## 2024-01-16 NOTE — Progress Notes (Signed)
 Virtual Visit via Video Note  I connected with [name] on @TODAY @ at  9:00 AM EDT by a video enabled telemedicine application and verified that I am speaking with the correct person using two identifiers.  Location: Patient: at home Provider: at office   I discussed the limitations of evaluation and management by telemedicine and the availability of in person appointments. The patient expressed understanding and agreed to proceed.  I discussed the assessment and treatment plan with the patient. The patient was provided an opportunity to ask questions and all were answered. The patient agreed with the plan and demonstrated an understanding of the instructions.   The patient was advised to call back or seek an in-person evaluation if the symptoms worsen or if the condition fails to improve as anticipated.  I provided 30 minutes of non-face-to-face time during this encounter.   Sherry Hoover, M.Ed,CNA   Patient ID: Sherry Hoover, female   DOB: 05/12/1971, 53 y.o.   MRN: [id number] As per previous CCA states:  This is a 53 yr old, widowed, unemployed female, who was referred per [facility]; treatment for Bipolar 1 D/O and functional neurological d/o.  Pt reports she was dx with bipolar d/o ~ seven yrs ago.  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  Sister passed in April 15, 2023; husband died d/t heart issues in 03-Nov-2024and shortly after pt's mother passed in [place] .  Pt moved her father down here with her.  Pt is his caretaker.  Pt states she thinks her father has some mental health issues.  2) Medical Issues:  Epilepsy and Functional Neurological Disorder (FND).  Pt denied any prior psychiatric hospitalizations; except for Epilepsy (Nov. 2024).  Has been seeing Dr. Dorian Chandler @ [facility].  Admits to a suicide attempt (OD) in [employer].   Patient was referred back to virtual MH-IOP d/t worsening depressive symptoms.  Continues to  see Dr. Skeen and Sardie Izzar, LCSW on an outpatient basis. Current Symptoms/Problems: Sadness, tearfulness, anhedonia, decreased sleep (awakenings), low energy, decreased appetite, low self-esteem, poor concentration, isolative, irritable, and restless. Admits to passive SI (denies a plan or intent).  Denies HI or A/V hallucinations.  Scored 20 on PHQ-9. A:  Re-oriented pt.  Informed Dr. Skeen and Sardie Izzar, LCSW of admit.  Pt was advised of ROI must be obtained prior to any records release in order to collaborate her care with an outside provider.  Pt was advised if she has not already done so to contact the front desk to sign all necessary forms in order for MH-IOP to release info re: her care.  Consent:  Pt gives verbal consent for tx and assignment of benefits for services provided during this telehealth group process.  Pt expressed understanding and agreed to proceed. Collaboration of care:  Collaborate with Staci Kerns, NP AEB; Dr. Dorian Chandler, AEB; Sardie Izzar, LCSW AEB; and [employer], LCSW AEB.   Encouraged support groups through [facility] [employer] [employer].   Pt will improve her mood as evidenced by being happy again, managing her mood and coping with daily stressors for 5 out of 7 days for 60 days.  R:  Pt receptive.    Hoover Sherry, M.Ed,CNA

## 2024-01-16 NOTE — Progress Notes (Signed)
 Virtual Visit via Video Note  I connected with Sherry Hoover on 01/16/24 at  9:00 AM EDT by a video enabled telemedicine application and verified that I am speaking with the correct person using two identifiers.  Location: Patient: Home Provider: Office   I discussed the limitations of evaluation and management by telemedicine and the availability of in person appointments. The patient expressed understanding and agreed to proceed.   I discussed the assessment and treatment plan with the patient. The patient was provided an opportunity to ask questions and all were answered. The patient agreed with the plan and demonstrated an understanding of the instructions.   The patient was advised to call back or seek an in-person evaluation if the symptoms worsen or if the condition fails to improve as anticipated.  I provided 15 minutes of non-face-to-face time during this encounter.   Staci LOISE Kerns, NP    Psychiatric Initial Adult Assessment   Patient Identification: Sherry Hoover MRN:  [id number] Date of Evaluation:  01/16/2024 Referral Source: Dorian Chandler  PA Chief Complaint:   Chief Complaint  Patient presents with   Depression   Anxiety   Stress   Visit Diagnosis:    ICD-10-CM   1. Bipolar I disorder (HCC)  F31.9     2. Encounter for screening examination for mental health and behavioral disorders  Z13.30       History of Present Illness:  Sherry Hoover is a 53 year old African-American female who was referred by her psychiatric provider, Dorian Chandler PA.   She carries a diagnosis related to bipolar disorder, generalized anxiety disorder and major depressive disorder.  She is currently prescribed Tegretol 600 mg twice daily, Seroquel 400 mg nightly and Lunesta 3 mg as needed.  Reports she previously attended intensive outpatient programming however, had to discharge early due to family issues.  She reports multiple psychosocial stressors.  States she has a diagnosis related to  functional neurological disorder FND which has consumed a lot of her time and has affected her memory and ADLs.  States this diagnosis has had it to her depression and anxiety symptoms. 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  She reports her medications have been pill packed so she does not forget to take medications.  She denied any suicidal or homicidal ideations.  Denies auditory or visual hallucinations.  Does report passive suicidal thoughts occasionally.  Denied plan or intent.  Reports a family history related to mental illness.  States her second son struggled with depression and suicidal ideations.  States her daughter was diagnosed with depression.  She reports her father undiagnosed struggled with traumatic experience as he was in foster care she reports her grandparents struggled with alcohol use disorder.  She denies that she is currently employed.  States she is currently appealing disability decision.  States she has since obtained a lawyer to help with her case.  She denied illicit drug use or substance abuse history.  Reports struggling with grief and loss as she reports the passing of her husband, mother and father who passed months apart in 2023.  patient to start Intensive outpatient programming (IOP) on 01/16/2024   During evaluation Sherry Hoover is sitting,she is alert/oriented x 4; calm/cooperative; and mood congruent with affect.  Patient is speaking in a clear tone at moderate volume, and normal pace; with good eye contact.  Her thought process is coherent and relevant; There is no indication that she is currently responding to internal/external stimuli or experiencing delusional  thought content.  Patient denies suicidal/self-harm/homicidal ideation, psychosis, and paranoia.  Patient has remained calm throughout assessment and has answered questions appropriately.    Associated Signs/Symptoms: Depression Symptoms:  depressed mood, feelings of  worthlessness/guilt, difficulty concentrating, anxiety, (Hypo) Manic Symptoms:  Distractibility, Irritable Mood, Anxiety Symptoms:  Excessive Worry, Psychotic Symptoms:  Hallucinations: Visual PTSD Symptoms: Had a traumatic exposure in the last month:  FND symptoms   Past Psychiatric History:   Previous Psychotropic Medications: Yes   Substance Abuse History in the last 12 months:  No.  Consequences of Substance Abuse: NA  Past Medical History:  Past Medical History:  Diagnosis Date   Epilepsy (HCC)     Past Surgical History:  Procedure Laterality Date   BREAST SURGERY     STOMACH SURGERY     TONSILLECTOMY      Family Psychiatric History:   Family History:  Family History  Problem Relation Age of Onset   Depression Father     Social History:   Social History   Socioeconomic History   Marital status: Widowed    Spouse name: Not on file   Number of children: 3   Years of education: Not on file   Highest education level: Some college, no degree  Occupational History   Not on file  Tobacco Use   Smoking status: Never   Smokeless tobacco: Never  Vaping Use   Vaping status: Never Used  Substance and Sexual Activity   Alcohol use: Never   Drug use: Never   Sexual activity: Not Currently  Other Topics Concern   Not on file  Social History Narrative   Not on file   Social Drivers of Health   Financial Resource Strain: Low Risk  (08/12/2022)   Received from [facility]   Overall Financial Resource Strain (CARDIA)    Difficulty of Paying Living Expenses: Not very hard  Food Insecurity: No Food Insecurity (08/12/2022)   Received from [facility]   Hunger Vital Sign    Within the past 12 months, you worried that your food would run out before you got the money to buy more.: Never true    Within the past 12 months, the food you bought just didn't last and you didn't have money to get more.: Never true  Transportation Needs: No Transportation Needs  (08/12/2022)   Received from [facility] - Transportation    Lack of Transportation (Medical): No    Lack of Transportation (Non-Medical): No  Physical Activity: Inactive (01/09/2020)   Received from [facility] visits prior to 09/11/2022.   Exercise Vital Sign    On average, how many days per week do you engage in moderate to strenuous exercise (like a brisk walk)?: 0 days    On average, how many minutes do you engage in exercise at this level?: 0 min  Stress: No Stress Concern Present (01/09/2020)   Received from [facility] visits prior to 09/11/2022.   [employer] of Occupational Health - Occupational Stress Questionnaire    Feeling of Stress : Not at all  Social Connections: Unknown (06/09/2022)   Received from [employer]   Social Network    Social Network: Not on file    Additional Social History:   Allergies:  Not on File  Metabolic Disorder Labs: No results found for: HGBA1C, MPG No results found for: PROLACTIN No results found for: CHOL, TRIG, HDL, CHOLHDL, VLDL, LDLCALC No results found for: TSH  Therapeutic  Level Labs: No results found for: LITHIUM No results found for: CBMZ No results found for: VALPROATE  Current Medications: Current Outpatient Medications  Medication Sig Dispense Refill   ARIPiprazole (ABILIFY) 10 MG tablet Take 10 mg by mouth daily.     lamoTRIgine (LAMICTAL) 25 MG tablet Take 100 mg by mouth daily.      levETIRAcetam (KEPPRA) 100 MG/ML solution Take by mouth 2 (two) times daily.     levETIRAcetam (KEPPRA) 250 MG tablet Take 200 mg by mouth 2 (two) times daily.     No current facility-administered medications for this visit.    Musculoskeletal: Virtual assessment   Psychiatric Specialty Exam: Review of Systems  There were no vitals taken for this visit.There is no height or weight on file to calculate BMI.  General Appearance: Casual  Eye Contact:   Good  Speech:  Clear and Coherent  Volume:  Normal  Mood:  Anxious and Depressed  Affect:  Congruent  Thought Process:  Coherent  Orientation:  Full (Time, Place, and Person)  Thought Content:  Logical  Suicidal Thoughts:  No  Homicidal Thoughts:  No  Memory:  Immediate;   Good Recent;   Good  Judgement:  Good  Insight:  Good  Psychomotor Activity:  Normal  Concentration:  Concentration: Good  Recall:  Good  Fund of Knowledge:Good  Language: Good  Akathisia:  No  Handed:  Right  AIMS (if indicated):  not done  Assets:  Communication Skills Desire for Improvement  ADL's:  Intact  Cognition: WNL  Sleep:  Good   Screenings: PHQ2-9    Flowsheet Row Counselor from 08/10/2023 in BEHAVIORAL HEALTH INTENSIVE PSYCH  PHQ-2 Total Score 6  PHQ-9 Total Score 27   Flowsheet Row Counselor from 08/10/2023 in BEHAVIORAL HEALTH INTENSIVE PSYCH  C-SSRS RISK CATEGORY Error: Question 6 not populated    Assessment and Plan:  Patient to start intensive outpatient programming Continue medications as directed  Collaboration of Care: Medication Management AEB Tegretol, Seroquel, hydroxyzine and temazepam for sleep disturbance  patient enrolled in Intensive outpatient programming, patient's current medications are to be continued. a comprehensive treatment plan will be developed and side effects of medications have been reviewed with patient  Treatment options and alternatives reviewed with patient and patient understands the above plan. Treatment plan was reviewed and agreed upon by NP T.Ezzard and patient [employer] need for group services.  Patient/Guardian was advised Release of Information must be obtained prior to any record release in order to collaborate their care with an outside provider. Patient/Guardian was advised if they have not already done so to contact the registration department to sign all necessary forms in order for us  to release information regarding their care.    Consent: Patient/Guardian gives verbal consent for treatment and assignment of benefits for services provided during this visit. Patient/Guardian expressed understanding and agreed to proceed.   Staci LOISE Ezzard, NP 7/7/202511:17 AM

## 2024-01-17 ENCOUNTER — Other Ambulatory Visit (HOSPITAL_COMMUNITY): Admitting: Licensed Clinical Social Worker

## 2024-01-17 DIAGNOSIS — F411 Generalized anxiety disorder: Secondary | ICD-10-CM | POA: Diagnosis not present

## 2024-01-17 DIAGNOSIS — F319 Bipolar disorder, unspecified: Secondary | ICD-10-CM

## 2024-01-17 NOTE — Progress Notes (Signed)
 Virtual Visit via Video Note   I connected with Chaniah Wesenberg on 01/17/24 at  9:00 AM EDT by a video enabled telemedicine application and verified that I am speaking with the correct person using two identifiers.   At orientation to the IOP program, Case Manager discussed the limitations of evaluation and management by telemedicine and the availability of in person appointments. The patient expressed understanding and agreed to proceed with virtual visits throughout the duration of the program.   Location:  Patient: Patient Home Provider: Home Office   History of Present Illness: Bipolar I Disorder    Observations/Objective: Check In: Case Manager checked in with all participants to review discharge dates, insurance authorizations, work-related documents and needs from the treatment team regarding medications. Molly stated needs and engaged in discussion.    Initial Therapeutic Activity: Counselor facilitated a check-in with Keyonia to assess for safety, sobriety and medication compliance.  Counselor also inquired about Neda's current emotional ratings, as well as any significant changes in thoughts, feelings or behavior since previous check in.  Dennys presented for session on time and was alert, oriented x5, with no evidence or self-report of active SI/HI or A/V H.  Maranatha reported compliance with medication and denied use of alcohol or illicit substances.  Jamiya reported scores of 6/10 for depression, 4/10 for anxiety, and 2/10 for anger/irritability.  Rickelle denied any recent outbursts or panic attacks.  Annamay reported that a struggle has been isolating inside her home most days.  Latonga reported that her goal today is to work on completing some paperwork for the case manager to stay busy and follow up with her doctor about a medication order.     Second Therapeutic Activity: Counselor discussed topic of sleep hygiene today with group.  Counselor defined this as the habits,  behaviors and environmental factors that can be adjusted to improve overall sleep quality.  Counselor also discussed how lack of consistent sleep can negatively affect mood and overall mental health.  Counselor provided members with a screening to complete assessing typical barriers one might face in achieving quality sleep at night (i.e. struggling to get up in the morning, waking up throughout the night, tossing/turning, etc), and inquired about members' perception of sleep hygiene at present.  Counselor offered techniques to members via a virtual handout which could be implemented in order to improve sleep hygiene, such as sticking to a normal morning/night routine, avoiding using of electronics too close to bedtime, avoiding heavy meals before bed, using a sleep journal to track changes and address anxious thoughts, as well as avoiding naps during the daytime, ensuring proper use of medications if prescribed any by provider(s), and more.  Counselor inquired about changes members intend to make to sleep hygiene based upon information covered today.  Interventions were effective, as evidenced by Amarrah actively participating in discussion on subject, reporting that she has had a long running problem with sleep, and has tried medicine, and testing to resolve this.  Crysal reported that she is currently on Lunesta, which 'knocks' her out, but she finds she cannot stay asleep.  Lashonda stated "I get about 4 hours most nights, but if I get manic, I'll go go go until I crash".  Serria reported that she plans to improve sleep hygiene over the course of treatment by maintaining a regular sleep/wake routine each day, taking a bath at nighttime to induce sleepiness, avoiding coffee too close to bedtime, spending less time in the bedroom during daytime, and listening to classical  music that relaxes her.    Assessment and Plan: Counselor recommends that Erlean remain in IOP treatment to better manage mental health  symptoms, ensure stability and pursue completion of treatment plan goals. Counselor recommends adherence to crisis/safety plan, taking medications as prescribed, and following up with medical professionals if any issues arise.    Follow Up Instructions: Counselor will send [employer] Teams link for session tomorrow.  Tywana was advised to call back or seek an in-person evaluation if the symptoms worsen or if the condition fails to improve as anticipated.   Collaboration of Care:   Medication Management AEB Staci Kerns, NP                                           Case Manager AEB Ricka Gaskins, CNA    Patient/Guardian was advised Release of Information must be obtained prior to any record release in order to collaborate their care with an outside provider. Patient/Guardian was advised if they have not already done so to contact the registration department to sign all necessary forms in order for us  to release information regarding their care.    Consent: Patient/Guardian gives verbal consent for treatment and assignment of benefits for services provided during this visit. Patient/Guardian expressed understanding and agreed to proceed.   I provided 180 minutes of non-face-to-face time during this encounter.   [name], LCSW, LCAS 01/17/24

## 2024-01-18 ENCOUNTER — Other Ambulatory Visit (HOSPITAL_COMMUNITY): Admitting: Licensed Clinical Social Worker

## 2024-01-18 DIAGNOSIS — F411 Generalized anxiety disorder: Secondary | ICD-10-CM | POA: Diagnosis not present

## 2024-01-18 DIAGNOSIS — F319 Bipolar disorder, unspecified: Secondary | ICD-10-CM

## 2024-01-18 NOTE — Progress Notes (Signed)
 Virtual Visit via Video Note   I connected with Sherry Hoover on 01/18/24 at  9:00 AM EDT by a video enabled telemedicine application and verified that I am speaking with the correct person using two identifiers.   At orientation to the IOP program, Case Manager discussed the limitations of evaluation and management by telemedicine and the availability of in person appointments. The patient expressed understanding and agreed to proceed with virtual visits throughout the duration of the program.   Location:  Patient: Patient Home Provider: Home Office   History of Present Illness: Bipolar I Disorder    Observations/Objective: Check In: Case Manager checked in with all participants to review discharge dates, insurance authorizations, work-related documents and needs from the treatment team regarding medications. Sherry Hoover stated needs and engaged in discussion.    Initial Therapeutic Activity: Counselor facilitated a check-in with Sherry Hoover to assess for safety, sobriety and medication compliance.  Counselor also inquired about Sherry Hoover's current emotional ratings, as well as any significant changes in thoughts, feelings or behavior since previous check in.  Sherry Hoover presented for session on time and was alert, oriented x5, with no evidence or self-report of active SI/HI or A/V H.  Sherry Hoover denied use of alcohol or illicit substances.  Sherry Hoover reported scores of 5/10 for depression, 8/10 for anxiety, and 0/10 for anger/irritability.  Sherry Hoover denied any recent outbursts or panic attacks.  Sherry Hoover reported that a struggle was running out of her medicine, and not receiving this in the mail yet.  She reported that she plans to reach out to her case worker and provider today to have the medicine sent to the pharmacy.  Sherry Hoover reported that an additional struggle was turning down her daughter's offer to go to [employer] due to feeling too anxious.  Sherry Hoover reported that her goal today is to do some cleaning around the  house.          Second Therapeutic Activity: Counselor utilized a Cabin crew with group members today to guide discussion on topic of codependency.  This handout defined codependency as excessive emotional or psychological reliance upon someone who requires support on account of an illness or addiction.  It also explained how this issue presents in dysfunctional family systems, including behavior such as denying existence of problems, rigid boundaries on communication, strained trust, lack of individuality, and reinforcement of unhealthy coping mechanisms such as substance use.  Characteristics of co-dependent people were listed for assistance with identification, such as extreme need for approval/recognition, difficulty identifying feelings, poor communication, and more.  Members were also tasked with completing a questionnaire in order to identify signs of codependency and results were discussed afterward.  This handout also offered strategies for resolving co-dependency within one's network, including increased use of assertive communication skills in order to set appropriate boundaries.  Intervention was effective, as evidenced by Sherry Hoover actively participating in discussion on the subject, and completing codependency questionnaire, with 18 out of 20 positive responses.  Sherry Hoover reported that she grew up in a dysfunctional family and was expected to be the caregiver, which exhausted her, and led her to neglect her own needs.  Sherry Hoover stated "We didn't talk about our problems We kept everything under wraps".  She reported that her goal will be to begin setting boundaries in her support network to curb caretaking behaviors and focus more upon her own needs.   Assessment and Plan: Counselor recommends that Sherry Hoover remain in IOP treatment to better manage mental health symptoms, ensure stability and pursue completion of treatment  plan goals. Counselor recommends adherence to crisis/safety plan, taking  medications as prescribed, and following up with medical professionals if any issues arise.    Follow Up Instructions: Counselor will send [employer] Teams link for session tomorrow.  Sherry Hoover was advised to call back or seek an in-person evaluation if the symptoms worsen or if the condition fails to improve as anticipated.   Collaboration of Care:   Medication Management AEB Staci Kerns, NP                                           Case Manager AEB Ricka Gaskins, CNA    Patient/Guardian was advised Release of Information must be obtained prior to any record release in order to collaborate their care with an outside provider. Patient/Guardian was advised if they have not already done so to contact the registration department to sign all necessary forms in order for us  to release information regarding their care.    Consent: Patient/Guardian gives verbal consent for treatment and assignment of benefits for services provided during this visit. Patient/Guardian expressed understanding and agreed to proceed.   I provided 180 minutes of non-face-to-face time during this encounter.   [name], LCSW, LCAS 01/18/24

## 2024-01-19 ENCOUNTER — Other Ambulatory Visit (HOSPITAL_COMMUNITY): Admitting: Licensed Clinical Social Worker

## 2024-01-19 DIAGNOSIS — F319 Bipolar disorder, unspecified: Secondary | ICD-10-CM

## 2024-01-19 DIAGNOSIS — F411 Generalized anxiety disorder: Secondary | ICD-10-CM | POA: Diagnosis not present
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
## 2024-01-20 ENCOUNTER — Other Ambulatory Visit (HOSPITAL_COMMUNITY): Admitting: Psychiatry

## 2024-01-20 ENCOUNTER — Telehealth (HOSPITAL_COMMUNITY): Payer: Self-pay | Admitting: Psychiatry

## 2024-01-20 DIAGNOSIS — F411 Generalized anxiety disorder: Secondary | ICD-10-CM | POA: Diagnosis not present

## 2024-01-20 DIAGNOSIS — F319 Bipolar disorder, unspecified: Secondary | ICD-10-CM

## 2024-01-20 NOTE — Progress Notes (Signed)
 Virtual Visit via Video Note   I connected with Sherry Hoover on 01/20/24 at  9:00 AM EDT by a video enabled telemedicine application and verified that I am speaking with the correct person using two identifiers.   At orientation to the IOP program, Case Manager discussed the limitations of evaluation and management by telemedicine and the availability of in person appointments. The patient expressed understanding and agreed to proceed with virtual visits throughout the duration of the program.   Location:  Patient: [facility] Home Provider: [facility]   History of Present Illness: Bipolar I Disorder    Observations/Objective: Check In: Case Manager checked in with all participants to review discharge dates, insurance authorizations, work-related documents and needs from the treatment team regarding medications. Sherry Hoover stated needs and engaged in discussion.    Initial Therapeutic Activity: Counselor facilitated a check-in with Sherry Hoover to assess for safety, sobriety and medication compliance.  Counselor also inquired about Sherry Hoover's current emotional ratings, as well as any significant changes in thoughts, feelings or behavior since previous check in.  Sherry Hoover presented for session on time and was alert, oriented x5, with no evidence or self-report of active SI/HI or A/V H.  Sherry Hoover reported compliance with medication and denied use of alcohol or illicit substances.  Sherry Hoover reported scores of 6/10 for depression, 6/10 for anxiety, and 0/10 for anger/irritability.  Sherry Hoover denied any recent outbursts or panic attacks.  Sherry Hoover reported that a struggle was feeling drained after group yesterday, and taking time to rest.  Sherry Hoover reported that a success was listening to a podcast for self-care.  Sherry Hoover reported that her goal this weekend is to consider attending church with family.          Second Therapeutic Activity: Counselor provided demonstration of relaxation technique known as mindful  breathing meditation to help members increase sense of calm, resiliency, and control.  Counselor guided members through process of getting comfortable, achieving a relaxed breathing rhythm, and focusing on this for several minutes, allowing troubling thoughts and feelings to come and go without rumination.  Counselor processed effectiveness of activity afterward in discussion with members, including how this impacted their mental state, whether it was difficult to stay focused, and if they plan to include it in self-care routine to improve day-to-day coping.  Intervention was ineffective, as evidenced by Sherry Hoover participating in exercise, but reporting that she was too distracted to fully focus, stating "I kept my eyes open, but my mind kept going all over the place".     Third Therapeutic Activity: Psycho-educational portion of group was provided by Lorane Rochester, Interior and spatial designer of community education with [employer].  Alexandra provided information on history of her local agency, mission statement, and the variety of unique services offered which group members might find beneficial to engage in, including both virtual and in-person support groups, as well as peer support program for mentoring.  Alexandra offered time to answer member's questions regarding services and encouraged them to consider utilizing these services to assist in working towards their individual wellness goals.  Intervention was effective, as evidenced by Sherry Hoover participating in discussion with speaker on the subject, and reporting that she would consider joining some of the virtual support groups after discharge from [facility] in order to expand her network and gain more skills  Assessment and Plan: Counselor recommends that Sherry Hoover remain in IOP treatment to better manage mental health symptoms, ensure stability and pursue completion of treatment plan goals. Counselor recommends adherence to crisis/safety plan, taking medications as  prescribed, and following up with medical professionals if any issues arise.    Follow Up Instructions: Counselor will send [employer] Teams link for session tomorrow.  Sherry Hoover was advised to call back or seek an in-person evaluation if the symptoms worsen or if the condition fails to improve as anticipated.   Collaboration of Care:   Medication Management AEB Staci Kerns, NP                                           Case Manager AEB Ricka Gaskins, CNA    Patient/Guardian was advised Release of Information must be obtained prior to any record release in order to collaborate their care with an outside provider. Patient/Guardian was advised if they have not already done so to contact the registration department to sign all necessary forms in order for us  to release information regarding their care.    Consent: Patient/Guardian gives verbal consent for treatment and assignment of benefits for services provided during this visit. Patient/Guardian expressed understanding and agreed to proceed.   I provided 180 minutes of non-face-to-face time during this encounter.   [name], LCSW, LCAS 01/20/24

## 2024-01-20 NOTE — Telephone Encounter (Signed)
 D:  Placed call to remind pt that she is excused from virtual MH-IOP on 01-23-24, in order to see her psychiatrist and therapist at [facility].  A:  Inform treatment team.  R:  Pt receptive.

## 2024-01-23 ENCOUNTER — Telehealth (HOSPITAL_COMMUNITY): Payer: Self-pay | Admitting: Psychiatry

## 2024-01-23 ENCOUNTER — Other Ambulatory Visit (HOSPITAL_COMMUNITY): Admitting: Psychiatry

## 2024-01-23 NOTE — Telephone Encounter (Signed)
 D:  Pt phoned to clarify if she needed to attend today d/t appt with her psychiatrist (Dr. Dorian Chandler)?  A:  Informed pt that she would be excused from virtual MH-IOP today d/t her appt.  Informed treatment team.  R:  Pt receptive.

## 2024-01-23 NOTE — Progress Notes (Signed)
 Virtual Visit via Video Note  I connected with Sherry Hoover on 01/16/24 at  9:00 AM EDT by a video enabled telemedicine application and verified that I am speaking with the correct person using two identifiers.  Location: Patient: patient home Provider: clinical home office   I discussed the limitations of evaluation and management by telemedicine and the availability of in person appointments. The patient expressed understanding and agreed to proceed.  I discussed the assessment and treatment plan with the patient. The patient was provided an opportunity to ask questions and all were answered. The patient agreed with the plan and demonstrated an understanding of the instructions.   The patient was advised to call back or seek an in-person evaluation if the symptoms worsen or if the condition fails to improve as anticipated.  I provided 180 minutes of non-face-to-face time during this encounter.   Randall Bastos, LCSW   Daily Group Progress Note  Program: IOP  Group Time: 9:00 - 10:30  Participation Level: Active  Behavioral Response: Appropriate and Sharing  Type of Therapy:  Group Therapy  Summary of Progress: Clinician led check-in regarding current stressors and situation, and review of patient completed daily inventory. Clinician utilized active listening and empathetic response and validated patient emotions. Clinician facilitated processing group on pertinent issues.   Patient arrived within time allowed. Patient rates her mood at a 5 on a scale of 1-10 with 10 being best. Pt states she feels  okay. Pt reports some anxieties for beginning group today. Pt engaged in discussion and processed well with group.   Progress Towards Goals: Initial    Group Time: 10:30 - 12:00  Participation Level:  Active  Behavioral Response: Appropriate and Sharing  Type of Therapy: Group Therapy  Summary of Progress: Cln led discussion on reverting to old behaviors. Group members  discussed ways in which they feel they have reverted currently or in the past. Group members report fear of reverting to old behaviors and they struggle to manage that fear. Cln informed discussion with CBT thought challenging and DBT distress tolerance skills.   Pt engaged in discussion and shares her most problematic behaviors.   Progress Towards Goals: Initial    Randall Bastos, LCSW

## 2024-01-24 ENCOUNTER — Other Ambulatory Visit (HOSPITAL_COMMUNITY): Admitting: Licensed Clinical Social Worker

## 2024-01-24 DIAGNOSIS — F319 Bipolar disorder, unspecified: Secondary | ICD-10-CM

## 2024-01-24 DIAGNOSIS — F411 Generalized anxiety disorder: Secondary | ICD-10-CM | POA: Diagnosis not present
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
## 2024-01-25 ENCOUNTER — Other Ambulatory Visit (HOSPITAL_COMMUNITY): Admitting: Licensed Clinical Social Worker

## 2024-01-25 DIAGNOSIS — F319 Bipolar disorder, unspecified: Secondary | ICD-10-CM

## 2024-01-25 DIAGNOSIS — F411 Generalized anxiety disorder: Secondary | ICD-10-CM | POA: Diagnosis not present

## 2024-01-25 NOTE — Progress Notes (Signed)
 Virtual Visit via Video Note   I connected with Sherry Hoover on 01/25/24 at  9:00 AM EDT by a video enabled telemedicine application and verified that I am speaking with the correct person using two identifiers.   At orientation to the IOP program, Case Manager discussed the limitations of evaluation and management by telemedicine and the availability of in person appointments. The patient expressed understanding and agreed to proceed with virtual visits throughout the duration of the program.   Location:  Patient: [facility] Home Provider: [facility]   History of Present Illness: Bipolar I Disorder    Observations/Objective: Check In: Case Manager checked in with all participants to review discharge dates, insurance authorizations, work-related documents and needs from the treatment team regarding medications. Sherry Hoover stated needs and engaged in discussion.    Initial Therapeutic Activity: Counselor facilitated a check-in with Sherry Hoover to assess for safety, sobriety and medication compliance.  Counselor also inquired about Sherry Hoover's current emotional ratings, as well as any significant changes in thoughts, feelings or behavior since previous check in.  Sherry Hoover presented for session on time and was alert, oriented x5, with no evidence or self-report of active SI/HI or A/V H.  Sherry Hoover reported compliance with medication and denied use of alcohol or illicit substances.  Sherry Hoover reported scores of 5/10 for depression, 7/10 for anxiety, and 7/10 for anger/irritability.  Sherry Hoover denied any recent outbursts or panic attacks.  Sherry Hoover reported that a struggle was going online yesterday to look for a therapist, but not finding anyone that is covered by her insurance.  Sherry Hoover reported that a success was doing some exercise yesterday afternoon for self-care.  Sherry Hoover reported that her goal today is to contact her insurance about potential therapy options to be prepared before discharge.            Second  Therapeutic Activity: Counselor introduced Buel Cedar, [facility] Pharmacist, to provide psychoeducation on topic of medication compliance with members today.  Buel provided psychoeducation on classes of medications such as antidepressants, antipsychotics, what symptoms they are intended to treat, and any side effects one might encounter while on a particular prescription.  Time was allowed for clients to ask any questions they might have of [facility] regarding this specialty.  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  Sherry Hoover was receptive to feedback from pharmacist on how some medications she is prescribed could have cross benefits and treat both physical and mental ailments of concern.    Third Therapeutic Activity: Psycho-educational portion of group was co-facilitated by wellness director (Warren Louder, MS, MPH, [facility]) focused on self-care in daily life. Facilitator and group members discussed presented materials regarding importance of sleep, diet, and exercise. Group members discussed any changes they are willing to make to improve an area of self-care in their lives (physical, psychological, emotional, spiritual, relationship, professional) to improve overall mental health as they continue with treatment.  Intervention was effective, as evidenced by Sherry Hoover participating in discussion with speaker on the subject, reporting that exercise is very important for her self-care, as it improves her mood, and overall health.  Sherry Hoover reported that she likes to put a star on her calendar to remind her of this accomplishment when she is consistent.  She reported that one goal will be ensuring that she sets a realistic boundary with length of  time and effort she puts into workouts to avoid injury.      Assessment and Plan: Counselor recommends that Sherry Hoover remain in IOP treatment to better manage mental health symptoms, ensure stability and pursue completion of treatment plan goals. Counselor recommends adherence to crisis/safety plan, taking medications as prescribed, and following up with medical professionals if any issues arise.    Follow Up Instructions: Counselor will send [employer] Teams link for session tomorrow.  Sherry Hoover was advised to call back or seek an in-person evaluation if the symptoms worsen or if the condition fails to improve as anticipated.   Collaboration of Care:   Medication Management AEB Staci Kerns, NP                                           Case Manager AEB Ricka Gaskins, CNA    Patient/Guardian was advised Release of Information must be obtained prior to any record release in order to collaborate their care with an outside provider. Patient/Guardian was advised if they have not already done so to contact the registration department to sign all necessary forms in order for us  to release information regarding their care.    Consent: Patient/Guardian gives verbal consent for treatment and assignment of benefits for services provided during this visit. Patient/Guardian expressed understanding and agreed to proceed.   I provided 180 minutes of non-face-to-face time during this encounter.   [name], LCSW, LCAS 01/25/24

## 2024-01-26 ENCOUNTER — Other Ambulatory Visit (HOSPITAL_COMMUNITY): Admitting: Licensed Clinical Social Worker

## 2024-01-26 DIAGNOSIS — F411 Generalized anxiety disorder: Secondary | ICD-10-CM | POA: Diagnosis not present

## 2024-01-26 DIAGNOSIS — F319 Bipolar disorder, unspecified: Secondary | ICD-10-CM

## 2024-01-26 NOTE — Progress Notes (Signed)
 Virtual Visit via Video Note   I connected with Sherry Hoover on 01/26/24 at  9:00 AM EDT by a video enabled telemedicine application and verified that I am speaking with the correct person using two identifiers.   At orientation to the IOP program, Case Manager discussed the limitations of evaluation and management by telemedicine and the availability of in person appointments. The patient expressed understanding and agreed to proceed with virtual visits throughout the duration of the program.   Location:  Patient: Patient Home Provider: Home Office   History of Present Illness: Bipolar I Disorder    Observations/Objective: Check In: Case Manager checked in with all participants to review discharge dates, insurance authorizations, work-related documents and needs from the treatment team regarding medications. Sherry Hoover stated needs and engaged in discussion.    Initial Therapeutic Activity: Counselor facilitated a check-in with Sherry Hoover to assess for safety, sobriety and medication compliance.  Counselor also inquired about Sherry Hoover's current emotional ratings, as well as any significant changes in thoughts, feelings or behavior since previous check in.  Sherry Hoover presented for session on time and was alert, oriented x5, with no evidence or self-report of active SI/HI or A/V H.  Sherry Hoover reported compliance with medication and denied use of alcohol or illicit substances.  Sherry Hoover reported scores of 4/10 for depression, 7/10 for anxiety, and 0/10 for anger/irritability.  Sherry Hoover denied any recent outbursts or panic attacks.  Sherry Hoover reported that a struggle has been worrying about attending her son's orientation next week, stating "This will be the first time attending something like that myself".  Sherry Hoover reported that a success was helping her father yesterday, who is physically feeling better today.  Sherry Hoover reported that her goal today is to continue monitoring her dad and spend time with her daughter.           Second Therapeutic Activity: Counselor introduced topic of self-care today.  Counselor explained how this can be defined as the things one does to maintain good health and improve well-being.  Counselor provided members with a self-care assessment form to complete.  This handout featured various sub-categories of self-care, including physical, psychological/emotional, social, spiritual, and professional.  Members were asked to rank their engagement in the activities listed for each dimension on a scale of 1-3, with 1 indicating 'Poor', 2 indicating 'Ok', and 3 indicating 'Well'.  Counselor invited members to share results of their assessment, and inquired about which areas of self-care they are doing well in, as well as areas that require attention, and how they plan to begin addressing this during treatment.  Intervention was effective, as evidenced by Sherry Hoover successfully completing initial 2 sections of assessment and actively engaging in discussion on subject, reporting that she is excelling in areas such as eating healthy foods, wearing clothes that make her feel good, going to preventative medical appointments, doing comforting things, finding reasons to laugh, and talking about her problems, but would benefit from focusing more on areas such as resting when sick, eating regularly, participating in fun activities, getting enough sleep, and going on daytrips.  Sherry Hoover reported that she would work to improve self-care deficits by looking out for warning signs that could indicate she needs to rest more frequently, eating at least 3 meals each day to ensure proper nutrition, avoiding use of electronics at bedtime to avoid becoming too overstimulated, and planning a fun outing with her daughter somewhere spontaneous.    Assessment and Plan: Counselor recommends that Sherry Hoover remain in IOP treatment to better manage mental  health symptoms, ensure stability and pursue completion of treatment plan goals.  Counselor recommends adherence to crisis/safety plan, taking medications as prescribed, and following up with medical professionals if any issues arise.    Follow Up Instructions: Counselor will send [employer] Teams link for session tomorrow.  Sherry Hoover was advised to call back or seek an in-person evaluation if the symptoms worsen or if the condition fails to improve as anticipated.   Collaboration of Care:   Medication Management AEB Staci Kerns, NP                                           Case Manager AEB Ricka Gaskins, CNA    Patient/Guardian was advised Release of Information must be obtained prior to any record release in order to collaborate their care with an outside provider. Patient/Guardian was advised if they have not already done so to contact the registration department to sign all necessary forms in order for us  to release information regarding their care.    Consent: Patient/Guardian gives verbal consent for treatment and assignment of benefits for services provided during this visit. Patient/Guardian expressed understanding and agreed to proceed.   I provided 180 minutes of non-face-to-face time during this encounter.   [name], LCSW, LCAS 01/26/24

## 2024-01-27 ENCOUNTER — Other Ambulatory Visit (HOSPITAL_COMMUNITY): Admitting: Licensed Clinical Social Worker

## 2024-01-27 DIAGNOSIS — F319 Bipolar disorder, unspecified: Secondary | ICD-10-CM

## 2024-01-27 DIAGNOSIS — F411 Generalized anxiety disorder: Secondary | ICD-10-CM | POA: Diagnosis not present

## 2024-01-27 NOTE — Progress Notes (Signed)
 Virtual Visit via Video Note   I connected with Taylour Windt on 01/27/24 at  9:00 AM EDT by a video enabled telemedicine application and verified that I am speaking with the correct person using two identifiers.   At orientation to the IOP program, Case Manager discussed the limitations of evaluation and management by telemedicine and the availability of in person appointments. The patient expressed understanding and agreed to proceed with virtual visits throughout the duration of the program.   Location:  Patient: Patient Home Provider: Home Office   History of Present Illness: Bipolar I Disorder    Observations/Objective: Check In: Case Manager checked in with all participants to review discharge dates, insurance authorizations, work-related documents and needs from the treatment team regarding medications. Yissel stated needs and engaged in discussion.    Initial Therapeutic Activity: Counselor facilitated a check-in with Jimia to assess for safety, sobriety and medication compliance.  Counselor also inquired about Mel's current emotional ratings, as well as any significant changes in thoughts, feelings or behavior since previous check in.  Meyer presented for session on time and was alert, oriented x5, with no evidence or self-report of active SI/HI or A/V H.  Kashawn reported compliance with medication and denied use of alcohol or illicit substances.  Ellorie reported scores of 5/10 for depression, 7/10 for anxiety, and 0/10 for anger/irritability.  Michelene denied any recent outbursts or panic attacks.  Taneika reported that a success was helping her father, and eating lunch with her daughter who visited yesterday.  Krystian reported that her goal this weekend is to spend time with family cooking out.          Second Therapeutic Activity: Counselor introduced topic of self-esteem today and defined this as the value an individual places on oneself, based upon assessment of personal  worth as a human being and approval/disapproval of one's behavior. Counselor asked members to assess their level of self-esteem at this time based upon common indicators of high self-esteem, including: accepting oneself unconditionally;  having self-respect and deep seated belief that one matters; being unaffected by other people's opinions/criticisms; and showing good control over emotions.  Counselor also explained concept of one's inner critic which serves to highlight faults and minimize strengths, directly influencing low sense of self-esteem.  Counselor then provided handout on 'strengths and qualities', which featured questions to guide discussion and increase awareness of each member's unique individual abilities which could reinforce higher self-esteem. Examples of questions included: 'things I am good at', 'challenges I have overcome', and 'what I like about myself'.  Intervention was effective, as evidenced by Brooklen actively engaging in discussion on topic, and completing a self-esteem assessment, receiving a score of 35, which indicated a 'low' level of self-esteem at this time due to traits such as putting down her own abilities and skills, fearing rejection, and feeling unworthy of love and respect.  Aijah stated "I was bullied really badly in middle school.  I was the only black person and got called a bunch of names".  Naylin was receptive to several strategies offered today for increasing self-esteem during treatment, including prioritizing more self-care activities in her routine such as exercise and skincare, giving herself more credit for accomplishments such as earning bachelors degree, utilizing personal strengths such as kindness and bravery, asking for positive feedback from empathetic family members like her daughter, and being more mindful of setting firmer boundaries with 'toxic' people.  Assessment and Plan: Counselor recommends that [name] remain in IOP treatment to better  manage mental health symptoms, ensure stability and pursue completion of treatment plan goals. Counselor recommends adherence to crisis/safety plan, taking medications as prescribed, and following up with medical professionals if any issues arise.    Follow Up Instructions: Counselor will send [employer] Teams link for session tomorrow.  Bevelyn was advised to call back or seek an in-person evaluation if the symptoms worsen or if the condition fails to improve as anticipated.   Collaboration of Care:   Medication Management AEB Staci Kerns, NP                                           Case Manager AEB Ricka Gaskins, CNA    Patient/Guardian was advised Release of Information must be obtained prior to any record release in order to collaborate their care with an outside provider. Patient/Guardian was advised if they have not already done so to contact the registration department to sign all necessary forms in order for us  to release information regarding their care.    Consent: Patient/Guardian gives verbal consent for treatment and assignment of benefits for services provided during this visit. Patient/Guardian expressed understanding and agreed to proceed.   I provided 180 minutes of non-face-to-face time during this encounter.   [name], LCSW, LCAS 01/27/24

## 2024-01-30 ENCOUNTER — Other Ambulatory Visit (HOSPITAL_COMMUNITY): Admitting: Licensed Clinical Social Worker

## 2024-01-30 DIAGNOSIS — F319 Bipolar disorder, unspecified: Secondary | ICD-10-CM

## 2024-01-30 DIAGNOSIS — F411 Generalized anxiety disorder: Secondary | ICD-10-CM | POA: Diagnosis not present

## 2024-01-30 NOTE — Progress Notes (Signed)
 Virtual Visit via Video Note   I connected with Sherry Hoover on 01/30/24 at  9:00 AM EDT by a video enabled telemedicine application and verified that I am speaking with the correct person using two identifiers.   At orientation to the IOP program, Case Manager discussed the limitations of evaluation and management by telemedicine and the availability of in person appointments. The patient expressed understanding and agreed to proceed with virtual visits throughout the duration of the program.   Location:  Patient: Patient Home Provider: [facility]   History of Present Illness: Bipolar I Disorder    Observations/Objective: Check In: Case Manager checked in with all participants to review discharge dates, insurance authorizations, work-related documents and needs from the treatment team regarding medications. Sherry Hoover stated needs and engaged in discussion.    Initial Therapeutic Activity: Counselor facilitated a check-in with Sherry Hoover to assess for safety, sobriety and medication compliance.  Counselor also inquired about Sherry Hoover's current emotional ratings, as well as any significant changes in thoughts, feelings or behavior since previous check in.  Sherry Hoover presented for session on time and was alert, oriented x5, with no evidence or self-report of active SI/HI or A/V H.  Sherry Hoover reported compliance with medication and denied use of alcohol or illicit substances.  Sherry Hoover reported scores of 3/10 for depression, 7/10 for anxiety, and 0/10 for anger/irritability.  Sherry Hoover denied any recent outbursts or panic attacks.  Sherry Hoover reported that a struggle was cancelling on plans to see a movie.  Sherry Hoover reported that a success was cooking a nice meal over the weekend for family that was visiting for a birthday.  Sherry Hoover reported that her goal today is to get her nails done for self-care.          Second Therapeutic Activity: Counselor introduced topic of anger management today.  Counselor virtually  shared a handout with members on this subject featuring a variety of coping skills, and facilitated discussion on these approaches.  Examples included raising awareness of anger triggers, practicing deep breathing, keeping an anger log to better understand episodes, using diversion activities to distract oneself for 30 minutes, taking a time out when necessary, and being mindful of warning signs tied to thoughts or behavior.  Counselor inquired about which techniques group members have used before, what has proved to be helpful, what their unique warning signs might be, as well as what they will try out in the future to assist with de-escalation.  Intervention was effective, as evidenced by Sherry Hoover participating in discussion on activity, and reporting that she used to have anger issues before she was diagnosed and started on medication to help stabilize her mood.  Sherry Hoover reported that one of her main triggers is feeling like one of her family members have been mistreated, especially her children, since this has led to issues when they were in school and had problems.  Sherry Hoover reported that warning signs include flushed skin, outbursts, aggression, and increased heartrate.  Sherry Hoover requested to leave group at 11:30am to meet with her son before he heads out of town today.  Counselor granted request and informed care team of early departure from session.     Assessment and Plan: Counselor recommends that Sherry Hoover remain in IOP treatment to better manage mental health symptoms, ensure stability and pursue completion of treatment plan goals. Counselor recommends adherence to crisis/safety plan, taking medications as prescribed, and following up with medical professionals if any issues arise.    Follow Up Instructions: Counselor will send [employer] Teams  link for session tomorrow.  Sherry Hoover was advised to call back or seek an in-person evaluation if the symptoms worsen or if the condition fails to improve as  anticipated.   Collaboration of Care:   Medication Management AEB Staci Kerns, NP                                           Case Manager AEB Ricka Gaskins, CNA    Patient/Guardian was advised Release of Information must be obtained prior to any record release in order to collaborate their care with an outside provider. Patient/Guardian was advised if they have not already done so to contact the registration department to sign all necessary forms in order for us  to release information regarding their care.    Consent: Patient/Guardian gives verbal consent for treatment and assignment of benefits for services provided during this visit. Patient/Guardian expressed understanding and agreed to proceed.   I provided 150 minutes of non-face-to-face time during this encounter.   [name], LCSW, LCAS 01/30/24

## 2024-01-31 ENCOUNTER — Telehealth (HOSPITAL_COMMUNITY): Payer: Self-pay | Admitting: Psychiatry

## 2024-01-31 ENCOUNTER — Other Ambulatory Visit (HOSPITAL_COMMUNITY)

## 2024-01-31 DIAGNOSIS — F411 Generalized anxiety disorder: Secondary | ICD-10-CM | POA: Diagnosis not present

## 2024-01-31 DIAGNOSIS — F319 Bipolar disorder, unspecified: Secondary | ICD-10-CM

## 2024-01-31 NOTE — Progress Notes (Signed)
 Virtual Visit via Video Note   I connected with Teira Parton on 01/31/24 at  9:00 AM EDT by a video enabled telemedicine application and verified that I am speaking with the correct person using two identifiers.   At orientation to the IOP program, Case Manager discussed the limitations of evaluation and management by telemedicine and the availability of in person appointments. The patient expressed understanding and agreed to proceed with virtual visits throughout the duration of the program.   Location:  Patient: Patient Home Provider: Home Office    History of Present Illness: Bipolar I Disorder    Observations/Objective: Check In: Case Manager checked in with all participants to review discharge dates, insurance authorizations, work-related documents and needs from the treatment team regarding medications. Jamesyn stated needs and engaged in discussion.    Initial Therapeutic Activity: Counselor facilitated a check-in with Suleima to assess for safety, sobriety and medication compliance.  Counselor also inquired about Maniah's current emotional ratings, as well as any significant changes in thoughts, feelings or behavior since previous check in.  Malkie presented for session on time and was alert, oriented x5, with no evidence or self-report of active SI/HI or A/V H.  Meribeth reported compliance with medication and denied use of alcohol or illicit substances.  Mecca reported scores of 7/10 for depression, 8/10 for anxiety, and 0/10 for anger/irritability.  Ceyda denied any recent panic attacks.  Jessye reported that a struggle was having an outburst yesterday, which she thought was triggered by her son leaving and her missing him.  Edla reported that a success was getting a Water engineer yesterday for self-care.  Tashera reported that her goal today is to work out to alleviate some stress.            Second Therapeutic Activity: Counselor engaged the group in discussion on  managing work/life balance today to improve mental health and wellness.  Counselor explained how finding balance between responsibilities at home and work place can be challenging, and lead to increased stress.  Counselor facilitated discussion on what challenges members are currently, or have historically faced.  Counselor also discussed strategies for improving work/life balance while members work on their mental health during treatment.  Some of these included keeping track of time management; creating a list of priorities and scaling importance; setting realistic, measurable goals each day; establishing boundaries; taking care of health needs; and nurturing relationships at home and work for support.  Counselor inquired about areas where members feel they are excelling, as well as areas they could focus on during treatment. Intervention effectiveness could not be measured, as Kyonna did not participate in discussion on topic.    Assessment and Plan: Counselor recommends that Rama remain in IOP treatment to better manage mental health symptoms, ensure stability and pursue completion of treatment plan goals. Counselor recommends adherence to crisis/safety plan, taking medications as prescribed, and following up with medical professionals if any issues arise.    Follow Up Instructions: Counselor will send [employer] Teams link for session tomorrow.  Tangela was advised to call back or seek an in-person evaluation if the symptoms worsen or if the condition fails to improve as anticipated.   Collaboration of Care:   Medication Management AEB Staci Kerns, NP                                           Case  Manager AEB Ricka Gaskins, CNA    Patient/Guardian was advised Release of Information must be obtained prior to any record release in order to collaborate their care with an outside provider. Patient/Guardian was advised if they have not already done so to contact the registration department to sign all  necessary forms in order for us  to release information regarding their care.    Consent: Patient/Guardian gives verbal consent for treatment and assignment of benefits for services provided during this visit. Patient/Guardian expressed understanding and agreed to proceed.   I provided 180 minutes of non-face-to-face time during this encounter.   [name], LCSW, LCAS 01/31/24

## 2024-01-31 NOTE — Telephone Encounter (Signed)
 D:  Pt will be excused tomorrow (02-01-24) and Thurs. (02-02-24) due to a scheduled event; plans to return to virtual MH-IOP on 02-03-24.  A:  Inform treatment team.  R:  Pt receptive.

## 2024-02-01 ENCOUNTER — Other Ambulatory Visit (HOSPITAL_COMMUNITY)

## 2024-02-02 ENCOUNTER — Other Ambulatory Visit (HOSPITAL_COMMUNITY)

## 2024-02-03 ENCOUNTER — Other Ambulatory Visit (HOSPITAL_COMMUNITY): Admitting: Licensed Clinical Social Worker

## 2024-02-03 DIAGNOSIS — F411 Generalized anxiety disorder: Secondary | ICD-10-CM | POA: Diagnosis not present

## 2024-02-03 DIAGNOSIS — F319 Bipolar disorder, unspecified: Secondary | ICD-10-CM

## 2024-02-03 NOTE — Progress Notes (Signed)
 Virtual Visit via Video Note   I connected with Sherry Hoover on 02/03/24 at  9:00 AM EDT by a video enabled telemedicine application and verified that I am speaking with the correct person using two identifiers.   At orientation to the IOP program, Case Manager discussed the limitations of evaluation and management by telemedicine and the availability of in person appointments. The patient expressed understanding and agreed to proceed with virtual visits throughout the duration of the program.   Location:  Patient: Patient Home Provider: [facility]   History of Present Illness: Bipolar I Disorder    Observations/Objective: Check In: Case Manager checked in with all participants to review discharge dates, insurance authorizations, work-related documents and needs from the treatment team regarding medications. Sherry Hoover stated needs and engaged in discussion.    Initial Therapeutic Activity: Counselor facilitated a check-in with Sherry Hoover to assess for safety, sobriety and medication compliance.  Counselor also inquired about Sherry Hoover's current emotional ratings, as well as any significant changes in thoughts, feelings or behavior since previous check in.  Sherry Hoover presented for session on time and was alert, oriented x5, with no evidence or self-report of active SI/HI or A/V H.  Sherry Hoover reported compliance with medication and denied use of alcohol or illicit substances.  Sherry Hoover reported scores of 6/10 for depression, 8/10 for anxiety, and 7/10 for irritability.  Sherry Hoover denied any recent outbursts or panic attacks.  Sherry Hoover reported that a success was attending an orientation event for her son this week, which she had to miss group for.  She reported that it was stressful for her, but she tried to use her coping skills to manage this.  Sherry Hoover reported that her goal today is to take time to relax after group.            Second Therapeutic Activity: Counselor introduced topic of mental illness stigma  today.  Counselor explained how stigma is defined as someone viewing another person in a negative way due to distinguishing characteristics or traits which are thought to be a disadvantage, including negative beliefs or attitudes associated with people who have a mental health condition such as major depression and/or generalized anxiety.  Counselor explained that stigma can lead to discrimination as well, and the harmful effects of pervasive stigma include reluctance to seek help or treatment, lack of understanding by family, friends, co-workers or peers, bullying/harassment, or negative impact on one's perceived ability to overcome challenges or succeed in life.  Counselor offered several suggestions for coping with stigma regarding mental illness, including staying engaged in treatment through linkage with a therapist, psychiatrist, and/or support group, avoiding isolation, and speaking out against stigma when encountered to reverse societal impact and increase acceptance and understanding.  Counselor inquired about members' experiences with this issue, as well as what they have done to minimize the impact it can have on their lives.  Intervention was effective, as evidenced by Sherry Hoover actively engaging in discussion on the subject, reporting that her father had mental health issues, but none of the family talked about his behavior openly, and this led her to develop a negative view of mental health and transparency growing up.  She stated "Children were meant to be seen but not heard.  You shut up and don't ask questions".  She reported that she has also noticed stigma toward people with mental health issues during times of crisis, such as school shootings.  Sherry Hoover reported that she has also experienced discrimination by being denied life insurance due to  mental health conditions.  Sherry Hoover also participated in mental health stigma questionnaire, which revealed that she has a moderate level of stigma.     Assessment and Plan: Counselor recommends that Sherry Hoover remain in IOP treatment to better manage mental health symptoms, ensure stability and pursue completion of treatment plan goals. Counselor recommends adherence to crisis/safety plan, taking medications as prescribed, and following up with medical professionals if any issues arise.    Follow Up Instructions: Counselor will send [employer] Teams link for session tomorrow.  Sherry Hoover was advised to call back or seek an in-person evaluation if the symptoms worsen or if the condition fails to improve as anticipated.   Collaboration of Care:   Medication Management AEB Staci Kerns, NP                                           Case Manager AEB Ricka Gaskins, CNA    Patient/Guardian was advised Release of Information must be obtained prior to any record release in order to collaborate their care with an outside provider. Patient/Guardian was advised if they have not already done so to contact the registration department to sign all necessary forms in order for us  to release information regarding their care.    Consent: Patient/Guardian gives verbal consent for treatment and assignment of benefits for services provided during this visit. Patient/Guardian expressed understanding and agreed to proceed.   I provided 180 minutes of non-face-to-face time during this encounter.   [name], LCSW, LCAS 02/03/24

## 2024-02-06 ENCOUNTER — Other Ambulatory Visit (HOSPITAL_COMMUNITY): Admitting: Licensed Clinical Social Worker

## 2024-02-06 DIAGNOSIS — F411 Generalized anxiety disorder: Secondary | ICD-10-CM | POA: Diagnosis not present

## 2024-02-06 DIAGNOSIS — F319 Bipolar disorder, unspecified: Secondary | ICD-10-CM

## 2024-02-06 NOTE — Progress Notes (Signed)
 Virtual Visit via Video Note   I connected with Sherry Hoover on 02/06/24 at  9:00 AM EDT by a video enabled telemedicine application and verified that I am speaking with the correct person using two identifiers.   At orientation to the IOP program, Case Manager discussed the limitations of evaluation and management by telemedicine and the availability of in person appointments. The patient expressed understanding and agreed to proceed with virtual visits throughout the duration of the program.   Location:  Patient: Patient Home Provider: [facility]   History of Present Illness: Bipolar I Disorder    Observations/Objective: Check In: Case Manager checked in with all participants to review discharge dates, insurance authorizations, work-related documents and needs from the treatment team regarding medications. Sherry Hoover stated needs and engaged in discussion.    Initial Therapeutic Activity: Counselor facilitated a check-in with Sherry Hoover to assess for safety, sobriety and medication compliance.  Counselor also inquired about Sherry Hoover's current emotional ratings, as well as any significant changes in thoughts, feelings or behavior since previous check in.  Sherry Hoover presented for session on time and was alert, oriented x5, with no evidence or self-report of active SI/HI or A/V H.  Sherry Hoover reported compliance with medication and denied use of alcohol or illicit substances.  Sherry Hoover reported scores of 4/10 for depression, 7/10 for anxiety, and 3/10 for irritability.  Sherry Hoover denied any recent outbursts or panic attacks.  Sherry Hoover reported that a struggle was feeling frustrated with her father, who was making a mess of the house over the weekend.  Sherry Hoover reported that a success was cooking and cleaning around the house over the weekend to stay busy.  Sherry Hoover reported that her goal today is to have lunch with her daughter.          Second Therapeutic Activity: Counselor introduced topic of stress management  today.  Counselor provided definition of stress as feeling tense, overwhelmed, worn out, and/or exhausted, and noted that in small amounts, stress can be motivating until things become too overwhelming to manage.  Counselor also explained how stress can be acute (brief but intense) or chronic (long-lasting) and this can impact the severity of symptoms one can experience in the physical, emotional, and behavioral categories.  Counselor inquired about members' specific stressors, how long they have been prevalent, and the various symptoms that tend to manifest as a result.  Counselor also offered several stress management strategies to help improve members' coping ability, including journaling, gratitude practice, relaxation techniques, and time management tips.  Counselor also explained that research has shown a strong support network composed of trusted family, friends, or community members can increase resilience in times of stress, and inquired about who members can reach out to for help in managing stressors.  Counselor encouraged members to consider discussing stressor 'red flags' with their close supports that can be monitored and strategies for assisting them in times of crisis.  Intervention was effective, as evidenced by Sherry Hoover actively participating in discussion on subject, stating "I know I'm not good with handling stress. I need to learn better skills".  She reported that her most significant stressors include loneliness, lack of confidence, disorganization in the household, and family conflict.  Sherry Hoover was able to identify several warning signs related to stress, including seizure, headache, and lack of motivation.  Sherry Hoover reported that her stress management goal is to practice grounding skills x3 per week in order to better manage daily stressors.  Sherry Hoover also expressed receptiveness to several stress management strategies practiced  today in session, including practicing a deep breathing exercise,  and progressive muscle relaxation.     Assessment and Plan: Counselor recommends that Sherry Hoover remain in IOP treatment to better manage mental health symptoms, ensure stability and pursue completion of treatment plan goals. Counselor recommends adherence to crisis/safety plan, taking medications as prescribed, and following up with medical professionals if any issues arise.    Follow Up Instructions: Counselor will send [employer] Teams link for session tomorrow.  Sherry Hoover was advised to call back or seek an in-person evaluation if the symptoms worsen or if the condition fails to improve as anticipated.   Collaboration of Care:   Medication Management AEB Sherry Kerns, NP                                           Case Manager AEB Sherry Gaskins, CNA    Patient/Guardian was advised Release of Information must be obtained prior to any record release in order to collaborate their care with an outside provider. Patient/Guardian was advised if they have not already done so to contact the registration department to sign all necessary forms in order for us  to release information regarding their care.    Consent: Patient/Guardian gives verbal consent for treatment and assignment of benefits for services provided during this visit. Patient/Guardian expressed understanding and agreed to proceed.   I provided 180 minutes of non-face-to-face time during this encounter.   Sherry Ricker, LCSW, LCAS 02/06/24

## 2024-02-07 ENCOUNTER — Other Ambulatory Visit (HOSPITAL_COMMUNITY)

## 2024-02-08 ENCOUNTER — Encounter (HOSPITAL_COMMUNITY): Payer: Self-pay | Admitting: Psychiatry

## 2024-02-08 ENCOUNTER — Other Ambulatory Visit (HOSPITAL_COMMUNITY): Admitting: Psychiatry

## 2024-02-08 DIAGNOSIS — F319 Bipolar disorder, unspecified: Secondary | ICD-10-CM

## 2024-02-08 DIAGNOSIS — F411 Generalized anxiety disorder: Secondary | ICD-10-CM | POA: Diagnosis not present

## 2024-02-08 NOTE — Progress Notes (Signed)
 Virtual Visit via Telephone Note  I connected with Sherry Hoover on 02/08/24 at  9:00 AM EDT by telephone and verified that I am speaking with the correct person using two identifiers.  Location: Patient: Home Provider:  Home office    I discussed the limitations, risks, security and privacy concerns of performing an evaluation and management service by telephone and the availability of in person appointments. I also discussed with the patient that there may be a patient responsible charge related to this service. The patient expressed understanding and agreed to proceed.    I discussed the assessment and treatment plan with the patient. The patient was provided an opportunity to ask questions and all were answered. The patient agreed with the plan and demonstrated an understanding of the instructions.   The patient was advised to call back or seek an in-person evaluation if the symptoms worsen or if the condition fails to improve as anticipated.  I provided 15 minutes of non-face-to-face time during this encounter.   Staci LOISE Kerns, NP   [facility] Intensive Outpatient Program Discharge Summary  Sherry Hoover [id number]  Admission date:  Discharge date: 02/09/2024  Reason for admission:  -Per admission assessment note: Dearra Myhand is a 53 year old African-American female who was referred by her psychiatric provider, Dorian Chandler PA.   She carries a diagnosis related to bipolar disorder, generalized anxiety disorder and major depressive disorder.  She is currently prescribed Tegretol 600 mg twice daily, Seroquel 400 mg nightly and Lunesta 3 mg as needed.  Reports she previously attended intensive outpatient programming however, had to discharge early due to family issues.  She reports multiple psychosocial stressors.  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   States this diagnosis has had it to her depression and anxiety symptoms. 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  She reports her medications have been pill packed so she does not forget to take medications.:   Progress in Program Toward Treatment Goals: Progressing patient attended and participated with daily group sessions with active and engaged participation.  Denying any suicidal or homicidal ideations at discharge.  Denies auditory visual hallucinations.  States she is looking forward to starting individual therapy.  States she is learned a lot of coping skills since attending intensive outpatient programming.  Such as setting boundaries,  I am going to try to put myself first.  She declined any medication refill at this time.  States she recently was seen and evaluated by her provider with medication refills on 7/28  Progress (rationale): Dr Chandler, PA. - Reported she is on the wait list for individual therapy services.  Collaboration of Care: Medication Management AEB Abilify, Lamictal  Patient/Guardian was advised Release of Information must be obtained prior to any record release in order to collaborate their care with an outside provider. Patient/Guardian was advised if they have not already done so to contact the registration department to sign all necessary forms in order for us  to release information regarding their care.   Consent: Patient/Guardian gives verbal consent for treatment and assignment of benefits for services provided during this visit. Patient/Guardian expressed understanding and agreed to proceed.   Staci Kerns NP  02/08/2024

## 2024-02-08 NOTE — Progress Notes (Signed)
 Virtual Visit via Video Note   I connected with Topanga Garrod on 02/08/24 at  9:00 AM EDT by a video enabled telemedicine application and verified that I am speaking with the correct person using two identifiers.   At orientation to the IOP program, Case Manager discussed the limitations of evaluation and management by telemedicine and the availability of in person appointments. The patient expressed understanding and agreed to proceed with virtual visits throughout the duration of the program.   Location:  Patient: Patient Home Provider: [facility]   History of Present Illness: Bipolar I Disorder    Observations/Objective: Check In: Case Manager checked in with all participants to review discharge dates, insurance authorizations, work-related documents and needs from the treatment team regarding medications. Halayna stated needs and engaged in discussion.    Initial Therapeutic Activity: Counselor facilitated a check-in with Hillary to assess for safety, sobriety and medication compliance.  Counselor also inquired about Daleisa's current emotional ratings, as well as any significant changes in thoughts, feelings or behavior since previous check in.  Indie presented for session on time and was alert, oriented x5, with no evidence or self-report of active SI/HI or A/V H.  Cyndee reported compliance with medication and denied use of alcohol or illicit substances.  Kaili reported scores of 5/10 for depression, 8/10 for anxiety, and 0/10 for anger/irritability.  Carolan denied any recent outbursts or panic attacks.  Joannie reported that a struggle was having to leave group early yesterday and go to the doctor, which led to her worrying about a potential hernia or GERD based upon what the doctor shared.  Carianna reported that her goal today is to wait to hear back from a gastroenterologist.            Second Therapeutic Activity: Counselor introduced Buel Cedar, [facility] Pharmacist, to provide  psychoeducation on topic of medication compliance with members today.  Buel provided psychoeducation on classes of medications such as antidepressants, antipsychotics, what symptoms they are intended to treat, and any side effects one might encounter while on a particular prescription.  Time was allowed for clients to ask any questions they might have of [facility] regarding this specialty.  Intervention effectiveness could not be measured, as client did not participate.    Third Therapeutic Activity: Counselor offered to teach group members an ACT relaxation technique today to aid in managing difficult thoughts, feelings, urges, and sensations.  Counselor guided members through process of getting comfortable, achieving relaxing breathing rhythm, and then maintaining this throughout activity.  Counselor invited members to imagine a gently flowing stream in their mind with leaves floating upon it, and when any thoughts, feelings, urges, or sensations arose, good or bad, they were instructed to visualize placing them on these passing leaves over course of 15 minutes practice.  Intervention was ineffective, as Inayah reported that she kept getting phone calls during this activity that distracted her, so she was unable to focus on instructions.    Assessment and Plan: Counselor recommends that Charm remain in IOP treatment to better manage mental health symptoms, ensure stability and pursue completion of treatment plan goals. Counselor recommends adherence to crisis/safety plan, taking medications as prescribed, and following up with medical professionals if any issues arise.    Follow Up Instructions: Counselor will send [employer] Teams link for session tomorrow.  Raylea was advised to call back or seek an in-person evaluation if the symptoms worsen or if the condition fails to improve as anticipated.   Collaboration of Care:  Medication Management AEB Staci Kerns, NP                                            Case Manager AEB Ricka Gaskins, CNA    Patient/Guardian was advised Release of Information must be obtained prior to any record release in order to collaborate their care with an outside provider. Patient/Guardian was advised if they have not already done so to contact the registration department to sign all necessary forms in order for us  to release information regarding their care.    Consent: Patient/Guardian gives verbal consent for treatment and assignment of benefits for services provided during this visit. Patient/Guardian expressed understanding and agreed to proceed.   I provided 180 minutes of non-face-to-face time during this encounter.   [name], LCSW, LCAS 02/08/24

## 2024-02-09 ENCOUNTER — Other Ambulatory Visit (HOSPITAL_COMMUNITY): Admitting: Psychiatry

## 2024-02-09 ENCOUNTER — Encounter (HOSPITAL_COMMUNITY): Payer: Self-pay | Admitting: Psychiatry

## 2024-02-09 DIAGNOSIS — F319 Bipolar disorder, unspecified: Secondary | ICD-10-CM

## 2024-02-09 DIAGNOSIS — F411 Generalized anxiety disorder: Secondary | ICD-10-CM | POA: Diagnosis not present

## 2024-02-09 NOTE — Progress Notes (Signed)
 Virtual Visit via Video Note   I connected with Neddie Kutch on 02/09/24 at  9:00 AM EDT by a video enabled telemedicine application and verified that I am speaking with the correct person using two identifiers.   At orientation to the IOP program, Case Manager discussed the limitations of evaluation and management by telemedicine and the availability of in person appointments. The patient expressed understanding and agreed to proceed with virtual visits throughout the duration of the program.   Location:  Patient: Patient Home [facility]: [facility]   History of Present Illness: Bipolar I Disorder    Observations/Objective: Check In: Case Manager checked in with all participants to review discharge dates, insurance authorizations, work-related documents and needs from the treatment team regarding medications. Audrea stated needs and engaged in discussion.    Initial Therapeutic Activity: Counselor facilitated a check-in with Helga to assess for safety, sobriety and medication compliance.  Counselor also inquired about Emmogene's current emotional ratings, as well as any significant changes in thoughts, feelings or behavior since previous check in.  Taygan presented for session on time and was alert, oriented x5, with no evidence or self-report of active SI/HI or A/V H.  Ha reported compliance with medication and denied use of alcohol or illicit substances.  Blondina reported scores of 5/10 for depression, 8/10 for anxiety, and 0/10 for anger/irritability.  Wanda denied any recent outbursts or panic attacks.  Samamtha reported that a struggle has been dealing with pain in her stomach today, although she is planning to see a hematologist now.  Ronalee reported that a success was abstaining from drinking coffee, which her doctor suggested.  Rakiyah reported that her goal today is to attend a dentist appointment for a routine cleaning in the afternoon.          Second Therapeutic Activity:  Counselor introduced topic of assertive communication today.  Counselor shared various handouts with members virtually in group to read along with on the subject.  These handouts defined assertive communication as a communication style in which a person stands up for their own needs and wants, while also taking into consideration the needs and wants of others, without behaving in a passive or aggressive way.  Traits of assertive communicators were highlighted such as using appropriate speaking volume, maintaining eye contact, using confident language, and avoiding interruption.  Members were also provided with tips on how to improve communication, including respecting oneself, expressing thoughts and feelings calmly, and saying "No" when necessary.  Members were given a variety of scenarios where they could practice using these tips to respond in an assertive manner.  Intervention effectiveness was mixed, as evidenced by [name] participating in discussion on topic, reporting that she has a passive communication style due to traits such as maintaining eye contact, prioritizing the needs of others, allowing others to take advantage, and lacking confidence.  Allysen was unable to participate in roleplay activities due to leaving at 10:50am complaining of stomach pain.  Counselor encouraged Lucrezia to go to urgent care if condition worsens.    Assessment and Plan: Avon has completed MHIOP and will be discharged today.  Counselor recommends adherence to crisis/safety plan, taking medications as prescribed, and following up with medical professionals if any issues arise.    Follow Up Instructions: Majel was advised to call back or seek an in-person evaluation if the symptoms worsen or if the condition fails to improve as anticipated.   Collaboration of Care:   Medication Management AEB Staci Kerns, NP  Case Manager AEB Ricka Gaskins, CNA    Patient/Guardian was  advised Release of Information must be obtained prior to any record release in order to collaborate their care with an outside provider. Patient/Guardian was advised if they have not already done so to contact the registration department to sign all necessary forms in order for us  to release information regarding their care.    Consent: Patient/Guardian gives verbal consent for treatment and assignment of benefits for services provided during this visit. Patient/Guardian expressed understanding and agreed to proceed.   I provided 110 minutes of non-face-to-face time during this encounter.   [name], LCSW, LCAS 02/09/24

## 2024-02-09 NOTE — Patient Instructions (Signed)
 D:  Pt completed virtual MH-IOP today.  A:  Follow up with Dr. Lucy Skeen and Sardie Izzar, LCSW on 02-20-24 @ 3pm (video).  Pt states her name has been added to list to be referred to a therapist at [facility].  Strongly recommend support groups.  R:  Patient receptive.

## 2024-02-09 NOTE — Progress Notes (Signed)
 Virtual Visit via Video Note  I connected with Sherry Hoover on @TODAY @ at  9:00 AM EDT by a video enabled telemedicine application and verified that I am speaking with the correct person using two identifiers.  Location: Patient: at home Provider: at office   I discussed the limitations of evaluation and management by telemedicine and the availability of in person appointments. The patient expressed understanding and agreed to proceed.  I discussed the assessment and treatment plan with the patient. The patient was provided an opportunity to ask questions and all were answered. The patient agreed with the plan and demonstrated an understanding of the instructions.   The patient was advised to call back or seek an in-person evaluation if the symptoms worsen or if the condition fails to improve as anticipated.  I provided 20 minutes of non-face-to-face time during this encounter.   GRETTA LEVAN, M.Ed,CNA   Patient ID: Sherry Hoover, female   DOB: 20-Nov-1970, 53 y.o.   MRN: [id number] As per previous CCA states:  This is a 53 yr old, widowed, unemployed female, who was referred per [facility]; treatment for Bipolar 1 D/O and functional neurological d/o.  Pt reports she was dx with bipolar d/o ~ seven yrs ago.  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  Sister passed in 03/24/23; husband died d/t heart issues in Oct 12, 2024and shortly after pt's mother passed in [place] .  Pt moved her father down here with her.  Pt is his caretaker.  Pt states she thinks her father has some mental health issues.  2) Medical Issues:  Epilepsy and Functional Neurological Disorder (FND).  Pt denied any prior psychiatric hospitalizations; except for Epilepsy (Nov. 2024).  Has been seeing Dr. Dorian Chandler @ [facility].  Admits to a suicide attempt (OD) in [employer].     Patient was referred back to virtual MH-IOP d/t worsening depressive symptoms.  Continues  to see Dr. Skeen and Sardie Izzar, LCSW on an outpatient basis. Current Symptoms/Problems: Sadness, tearfulness, anhedonia, decreased sleep (awakenings), low energy, decreased appetite, low self-esteem, poor concentration, isolative, irritable, and restless. Admits to passive SI (denies a plan or intent).  Denies HI or A/V hallucinations.  Scored 20 on PHQ-9.  Patient attended all scheduled days in virtual MH-IOP.  Although, pt states she is feeling some better, she still c/o severe anxiety.  I will continue practicing my skills that I learned.  I liked the aspect of having the support in the groups, but my ladies are gone and it's so quiet now in the groups.  On a scale of 1-10 (10 being the worst); pt rates her depression at a 6 and anxiety at a 7.5.  Denies SI/HI or A/V hallucinations.  Pt sent an email during break today stating that her stomach was hurting and wouldn't be able to rejoin the group.  A:  D/C pt today.  F/U with Dr. Lucy Skeen and Sardie Izzar, LCSW on 02-20-24 @ 3pm (video).  Reports her name has been added to a list for a therapist referral.  Called and checked on pt re: stomach ache.  Pt states she went to the doctor yesterday and they are sending her for further testing.  They think it may be a hernia and GERD.  Pt was advised of ROI must be obtained prior to any records release in order to collaborate her care with an outside provider.  Pt was advised if she has not  already done so to contact the front desk to sign all necessary forms in order for MH-IOP to release info re: her care.  Consent:  Pt gives verbal consent for tx and assignment of benefits for services provided during this telehealth group process.  Pt expressed understanding and agreed to proceed. Collaboration of care:  Collaborate with Staci Kerns, NP [facility]; Dr. Dorian Chandler, AEB; Sardie Izzar, LCSW [facility]; and [employer], LCSW [facility].   Encouraged support groups through [facility] [employer] and [facility] [employer].   R:  Pt receptive.     Ricka Gaskins, M.Ed,CNA

## 2024-02-10 ENCOUNTER — Other Ambulatory Visit (HOSPITAL_COMMUNITY)

## 2024-02-13 ENCOUNTER — Other Ambulatory Visit (HOSPITAL_COMMUNITY)

## 2024-02-14 ENCOUNTER — Other Ambulatory Visit (HOSPITAL_COMMUNITY)

## 2024-02-15 ENCOUNTER — Other Ambulatory Visit (HOSPITAL_COMMUNITY)

## 2024-02-16 ENCOUNTER — Other Ambulatory Visit (HOSPITAL_COMMUNITY)
